# Patient Record
Sex: Male | Born: 1970
Health system: Southern US, Community
[De-identification: ages and names within clinical notes are randomized; demographics above are authoritative.]

## PROBLEM LIST (undated history)

## (undated) DIAGNOSIS — T7840XA Allergy, unspecified, initial encounter: Secondary | ICD-10-CM

## (undated) DIAGNOSIS — K604 Rectal fistula: Secondary | ICD-10-CM

## (undated) DIAGNOSIS — B009 Herpesviral infection, unspecified: Secondary | ICD-10-CM

## (undated) DIAGNOSIS — R011 Cardiac murmur, unspecified: Secondary | ICD-10-CM

## (undated) DIAGNOSIS — J309 Allergic rhinitis, unspecified: Secondary | ICD-10-CM

## (undated) HISTORY — DX: Allergic rhinitis, unspecified: J30.9

## (undated) HISTORY — DX: Cardiac murmur, unspecified: R01.1

## (undated) HISTORY — DX: Allergy, unspecified, initial encounter: T78.40XA

## (undated) HISTORY — DX: Rectal fistula: K60.4

## (undated) HISTORY — DX: Herpesviral infection, unspecified: B00.9

---

## 1992-10-29 HISTORY — PX: OTHER SURGICAL HISTORY: SHX169

## 1992-10-30 ENCOUNTER — Encounter (INDEPENDENT_AMBULATORY_CARE_PROVIDER_SITE_OTHER): Payer: Self-pay | Admitting: Internal Medicine

## 1999-02-26 ENCOUNTER — Encounter (INDEPENDENT_AMBULATORY_CARE_PROVIDER_SITE_OTHER): Payer: Self-pay | Admitting: Internal Medicine

## 1999-02-26 LAB — CONVERTED CEMR LAB
Blood Glucose, Fasting: 88 mg/dL
TSH: 1.14 microintl units/mL

## 2005-12-12 ENCOUNTER — Encounter (INDEPENDENT_AMBULATORY_CARE_PROVIDER_SITE_OTHER): Payer: Self-pay | Admitting: Internal Medicine

## 2005-12-12 ENCOUNTER — Ambulatory Visit: Payer: Self-pay | Admitting: Family Medicine

## 2005-12-12 LAB — CONVERTED CEMR LAB
RBC count: 5.52 10*6/uL
TSH: 1.18 microintl units/mL
WBC, blood: 6.6 10*3/uL

## 2006-08-15 ENCOUNTER — Ambulatory Visit: Payer: Self-pay | Admitting: Family Medicine

## 2007-12-24 ENCOUNTER — Encounter: Payer: Self-pay | Admitting: Internal Medicine

## 2008-05-07 ENCOUNTER — Telehealth (INDEPENDENT_AMBULATORY_CARE_PROVIDER_SITE_OTHER): Payer: Self-pay | Admitting: Internal Medicine

## 2008-11-09 ENCOUNTER — Emergency Department (HOSPITAL_COMMUNITY): Admission: EM | Admit: 2008-11-09 | Discharge: 2008-11-09 | Payer: Self-pay | Admitting: Emergency Medicine

## 2009-03-27 ENCOUNTER — Ambulatory Visit: Payer: Self-pay | Admitting: Family Medicine

## 2009-03-27 DIAGNOSIS — B001 Herpesviral vesicular dermatitis: Secondary | ICD-10-CM | POA: Insufficient documentation

## 2009-03-27 DIAGNOSIS — B009 Herpesviral infection, unspecified: Secondary | ICD-10-CM | POA: Insufficient documentation

## 2009-03-27 DIAGNOSIS — J309 Allergic rhinitis, unspecified: Secondary | ICD-10-CM | POA: Insufficient documentation

## 2009-04-10 ENCOUNTER — Ambulatory Visit: Payer: Self-pay | Admitting: Family Medicine

## 2009-04-15 LAB — CONVERTED CEMR LAB
AST: 27 units/L (ref 0–37)
Albumin: 4.4 g/dL (ref 3.5–5.2)
Alkaline Phosphatase: 54 units/L (ref 39–117)
Basophils Relative: 0.4 % (ref 0.0–3.0)
CO2: 29 meq/L (ref 19–32)
Calcium: 9.3 mg/dL (ref 8.4–10.5)
Chloride: 108 meq/L (ref 96–112)
Eosinophils Absolute: 0.2 10*3/uL (ref 0.0–0.7)
HCT: 47.2 % (ref 39.0–52.0)
HDL: 31.6 mg/dL — ABNORMAL LOW (ref >39.00)
Hemoglobin: 16.6 g/dL (ref 13.0–17.0)
Lymphocytes Relative: 28.8 % (ref 12.0–46.0)
MCHC: 35.2 g/dL (ref 30.0–36.0)
Neutro Abs: 4 10*3/uL (ref 1.4–7.7)
Potassium: 4.6 meq/L (ref 3.5–5.1)
RBC: 5.27 M/uL (ref 4.22–5.81)
Sodium: 143 meq/L (ref 135–145)
Total CHOL/HDL Ratio: 6
Total Protein: 6.7 g/dL (ref 6.0–8.3)

## 2010-04-05 ENCOUNTER — Telehealth: Payer: Self-pay | Admitting: Family Medicine

## 2010-05-23 DIAGNOSIS — K604 Rectal fistula, unspecified: Secondary | ICD-10-CM

## 2010-05-23 HISTORY — DX: Rectal fistula, unspecified: K60.40

## 2010-05-23 HISTORY — DX: Rectal fistula: K60.4

## 2010-06-23 NOTE — Progress Notes (Signed)
Summary: refill request for famvir  Phone Note Refill Request Message from:  Fax from Pharmacy  Refills Requested: Medication #1:  FAMVIR 500 MG TABS take 1 three times a day as directed as needed   Last Refilled: 11/18/2009 Faxed request from [facility] [facility], pt has not been seen in almost a year, no appts scheduled.  [facility] pt.  Initial call taken by: Lowella Petties CMA, AAMA,  April 05, 2010 12:03 PM  Follow-up for Phone Call        may refill x 3 refills.  due for CPE at his convenience Follow-up by: Eustaquio Boyden  MD,  April 05, 2010 12:24 PM  Additional Follow-up for Phone Call Additional follow up Details #1::        Left message on mail notifying patient to call and schedule CPx. Additional Follow-up by: Janee Morn CMA Duncan Dull),  April 05, 2010 3:40 PM    Prescriptions: FAMVIR 500 MG TABS (FAMCICLOVIR) take 1 three times a day as directed as needed  #21 x 3   Entered and Authorized by:   Eustaquio Boyden  MD   Signed by:   Eustaquio Boyden  MD on 04/05/2010   Method used:   Electronically to        [facility]  [address] (retail)       [address]       [place], [place]  [address]       Ph: [phone]       Fax: [phone]   RxID:   [id number]

## 2010-08-30 LAB — DIFFERENTIAL
Basophils Absolute: 0 10*3/uL (ref 0.0–0.1)
Basophils Relative: 1 % (ref 0–1)
Neutro Abs: 2.2 10*3/uL (ref 1.7–7.7)
Neutrophils Relative %: 46 % (ref 43–77)

## 2010-08-30 LAB — POCT URINALYSIS DIP (DEVICE)
Hgb urine dipstick: NEGATIVE
Nitrite: NEGATIVE
Urobilinogen, UA: 1 mg/dL (ref 0.0–1.0)
pH: 5.5 (ref 5.0–8.0)

## 2010-08-30 LAB — CBC
MCHC: 34.5 g/dL (ref 30.0–36.0)
Platelets: 141 10*3/uL — ABNORMAL LOW (ref 150–400)
RDW: 12.8 % (ref 11.5–15.5)

## 2010-09-03 ENCOUNTER — Telehealth: Payer: Self-pay | Admitting: Family Medicine

## 2010-09-03 DIAGNOSIS — Z0000 Encounter for general adult medical examination without abnormal findings: Secondary | ICD-10-CM

## 2010-09-03 NOTE — Telephone Encounter (Signed)
Message copied by Eustaquio Boyden on Fri Sep 03, 2010  7:57 AM ------      Message from: Mills Koller      Created: Thu Sep 02, 2010  4:09 PM       Patient is scheduled for CPX labs, please order future labs, Thanks , Camelia Eng

## 2010-09-08 ENCOUNTER — Encounter: Payer: Self-pay | Admitting: Family Medicine

## 2010-09-08 ENCOUNTER — Other Ambulatory Visit (INDEPENDENT_AMBULATORY_CARE_PROVIDER_SITE_OTHER): Payer: 59 | Admitting: Family Medicine

## 2010-09-08 DIAGNOSIS — Z0000 Encounter for general adult medical examination without abnormal findings: Secondary | ICD-10-CM

## 2010-09-08 LAB — LIPID PANEL
HDL: 35.5 mg/dL — ABNORMAL LOW (ref >39.00)
Total CHOL/HDL Ratio: 5

## 2010-09-08 LAB — COMPREHENSIVE METABOLIC PANEL
ALT: 54 U/L — ABNORMAL HIGH (ref 0–53)
AST: 32 U/L (ref 0–37)
Albumin: 4.1 g/dL (ref 3.5–5.2)
Calcium: 9.2 mg/dL (ref 8.4–10.5)
Chloride: 106 mEq/L (ref 96–112)
Creatinine, Ser: 0.9 mg/dL (ref 0.4–1.5)
Potassium: 4.4 mEq/L (ref 3.5–5.1)

## 2010-09-10 ENCOUNTER — Encounter: Payer: Self-pay | Admitting: Family Medicine

## 2010-09-10 ENCOUNTER — Ambulatory Visit (INDEPENDENT_AMBULATORY_CARE_PROVIDER_SITE_OTHER): Payer: 59 | Admitting: Family Medicine

## 2010-09-10 VITALS — BP 118/72 | HR 90 | Temp 98.3°F | Ht 69.0 in | Wt 185.0 lb

## 2010-09-10 DIAGNOSIS — Z0000 Encounter for general adult medical examination without abnormal findings: Secondary | ICD-10-CM | POA: Insufficient documentation

## 2010-09-10 DIAGNOSIS — Z1283 Encounter for screening for malignant neoplasm of skin: Secondary | ICD-10-CM | POA: Insufficient documentation

## 2010-09-10 NOTE — Assessment & Plan Note (Addendum)
Discussed healthy living, utd tetanus, not due for prostate/colon screening and no early fmhx. Requests referral to derm for skin screening, nothing concerning on exam today, discussed sunscreen and hat use. Reviewed blood work.  Encouraged more aerobic exercise for increase in HDL.

## 2010-09-10 NOTE — Progress Notes (Signed)
  Subjective:    Patient ID: Wayne Smith, male    DOB: Sep 15, 1970, 40 y.o.   MRN: [id number]  HPI CC: CPE today.  No questions/concerns.  Allergic rhinitis - takes allegra daily.  Has been on for 10 years.  Preventative: Tetanus 2007 Recent blood work.  Reviewed with patient. No BM changes, no nocturia, no blood in stool, strong stream  Review of Systems  Constitutional: Negative for fever, chills, activity change, appetite change, fatigue and unexpected weight change.  HENT: Negative for hearing loss and neck pain.   Eyes: Negative for visual disturbance.  Respiratory: Negative for cough, chest tightness, shortness of breath and wheezing.   Cardiovascular: Negative for chest pain, palpitations and leg swelling.  Gastrointestinal: Negative for nausea, vomiting, abdominal pain, diarrhea, constipation, blood in stool and abdominal distention.  Genitourinary: Negative for hematuria and difficulty urinating.  Musculoskeletal: Negative for myalgias and arthralgias.  Skin: Negative for rash.  Neurological: Negative for dizziness, seizures, syncope and headaches.  Hematological: Does not bruise/bleed easily.  Psychiatric/Behavioral: Negative for dysphoric mood. The patient is not nervous/anxious.        Objective:   Physical Exam  Nursing note and vitals reviewed. Constitutional: He is oriented to person, place, and time. He appears well-developed and well-nourished.  HENT:  Head: Normocephalic and atraumatic.  Right Ear: External ear normal.  Left Ear: External ear normal.  Nose: Nose normal.  Mouth/Throat: Oropharynx is clear and moist. No oropharyngeal exudate.  Eyes: Conjunctivae and EOM are normal. Pupils are equal, round, and reactive to light. No scleral icterus.  Neck: Normal range of motion. Neck supple. No thyromegaly present.  Cardiovascular: Normal rate, regular rhythm, normal heart sounds and intact distal pulses.   No murmur heard. Pulses:      Radial pulses are 2+  on the right side, and 2+ on the left side.  Pulmonary/Chest: Effort normal and breath sounds normal. No respiratory distress. He has no wheezes. He has no rales.  Abdominal: Soft. Bowel sounds are normal. He exhibits no distension and no mass. There is no tenderness. There is no rebound and no guarding.  Musculoskeletal: Normal range of motion.  Lymphadenopathy:    He has no cervical adenopathy.  Neurological: He is alert and oriented to person, place, and time.       CN grossly intact, station and gait intact  Skin: Skin is warm and dry.       Multiple nevi throughout back, none abnormal.  No lesions on scalp  Psychiatric: He has a normal mood and affect. His behavior is normal. Judgment and thought content normal.          Assessment & Plan:

## 2010-09-10 NOTE — Patient Instructions (Signed)
Good to meet you today. Pass by Marion's office to set up dermatology appointment for skin cancer screening.  Nothing concerning found today. Call us with questions.

## 2010-11-12 ENCOUNTER — Ambulatory Visit (INDEPENDENT_AMBULATORY_CARE_PROVIDER_SITE_OTHER): Payer: 59 | Admitting: Family Medicine

## 2010-11-12 ENCOUNTER — Encounter: Payer: Self-pay | Admitting: Family Medicine

## 2010-11-12 VITALS — BP 110/80 | HR 84 | Temp 98.5°F | Wt 185.0 lb

## 2010-11-12 DIAGNOSIS — K603 Anal fistula: Secondary | ICD-10-CM | POA: Insufficient documentation

## 2010-11-12 DIAGNOSIS — L0231 Cutaneous abscess of buttock: Secondary | ICD-10-CM

## 2010-11-12 MED ORDER — HYDROCODONE-ACETAMINOPHEN 7.5-500 MG PO TABS: 1.0000 | ORAL_TABLET | Freq: Three times a day (TID) | ORAL | Status: DC | PRN

## 2010-11-12 NOTE — Patient Instructions (Signed)
Looking like it's healing well. If any fever, spreading redness or draining pus, return sooner. Otherwise return end of next week to recheck. If packing falls out, that is ok as well.

## 2010-11-12 NOTE — Progress Notes (Signed)
  Subjective:    Patient ID: Wayne Smith, male    DOB: 29-Mar-1971, 40 y.o.   MRN: [id number]  HPI CC: abscess on buttock recheck  Several week h/o abscess L buttock.  Tues am seen at [facility] ER, with abscess, I&D performed and started doxycyline (3 days so far of treatment).  Also packed.  Prescribed 10 day course. Still packed.  Getting some drainage out.  Denies fevers/chills, spreading redness, nausea/vomiting.  H/o abscesses in past, only one as a kid though.  Review of Systems Per HPI    Objective:   Physical Exam  Nursing note and vitals reviewed. Constitutional: He appears well-developed and well-nourished. No distress.  Skin: Skin is warm and dry.          Site of I&D left buttock medially inspected No surrounding erythema,fluctuance. 4in packing removed, probed about 2.5cm deep with cotton tip applicator, repacked with ~1in. Pt tolerated well.          Assessment & Plan:

## 2010-11-12 NOTE — Assessment & Plan Note (Signed)
No evidence of worsening infection. Repacked with small amt iodine gauze. Finish abx course.  Refilled hydrocodone for pain. Supportive care instructions discussed. Red flags to return discussed.

## 2010-11-19 ENCOUNTER — Ambulatory Visit (INDEPENDENT_AMBULATORY_CARE_PROVIDER_SITE_OTHER): Payer: 59 | Admitting: Family Medicine

## 2010-11-19 ENCOUNTER — Encounter: Payer: Self-pay | Admitting: Family Medicine

## 2010-11-19 VITALS — BP 112/72 | HR 88 | Temp 98.4°F | Ht 69.0 in | Wt 182.8 lb

## 2010-11-19 DIAGNOSIS — L0231 Cutaneous abscess of buttock: Secondary | ICD-10-CM

## 2010-11-19 NOTE — Assessment & Plan Note (Signed)
Overall improved, almost completely resolved. rec keen moist with vaseline for next 2-3 days to ensure fully closes as somewhat dry today. Update [place] if fever, return of pain or other concern.

## 2010-11-19 NOTE — Progress Notes (Signed)
  Subjective:    Patient ID: LESS WOOLSEY, male    DOB: 1970-10-22, 40 y.o.   MRN: [id number]  HPI CC: wound f/u  H/o L buttock abscess drained at [facility] ER.  Seen last week, repacked.  Completed doxy course.  Feeling overall better, no pain.  Review of Systems Per HPI    Objective:   Physical Exam     Almost closed L buttock wound, no discharge, no drainage, no erythema or induration.   Assessment & Plan:

## 2010-12-03 ENCOUNTER — Encounter: Payer: Self-pay | Admitting: Family Medicine

## 2010-12-03 ENCOUNTER — Ambulatory Visit (INDEPENDENT_AMBULATORY_CARE_PROVIDER_SITE_OTHER): Payer: 59 | Admitting: Family Medicine

## 2010-12-03 VITALS — BP 110/70 | HR 84 | Temp 98.7°F | Wt 184.1 lb

## 2010-12-03 DIAGNOSIS — L0291 Cutaneous abscess, unspecified: Secondary | ICD-10-CM

## 2010-12-03 DIAGNOSIS — L0390 Cellulitis, unspecified: Secondary | ICD-10-CM

## 2010-12-03 DIAGNOSIS — L03317 Cellulitis of buttock: Secondary | ICD-10-CM

## 2010-12-03 DIAGNOSIS — L0231 Cutaneous abscess of buttock: Secondary | ICD-10-CM

## 2010-12-03 MED ORDER — DOXYCYCLINE HYCLATE 100 MG PO TABS: 100.0000 mg | ORAL_TABLET | Freq: Two times a day (BID) | ORAL | Status: AC

## 2010-12-03 NOTE — Patient Instructions (Signed)
Soak in a warm bath twice a day and use warm compresses several times as day.  10 day of doxycycline and call if not improved.  Take care.

## 2010-12-03 NOTE — Progress Notes (Signed)
H/o abscess.  Lanced 6/19.  Was improving and then he had return of sx.  Reopened ~1 week ago. Less painful now.  Drainage is green/pus now.  No FCNAVD.  Feeling well o/w.    Meds, vitals, and allergies reviewed.   ROS: See HPI.  Otherwise, noncontributory.  nad Normal ext rectal exam except for small (<1cm) lesion with small opening, too small to pack.  Small amount of green fluid expressed.  Minimally ttp, tolerated well.  No erythema locally.

## 2010-12-05 NOTE — Assessment & Plan Note (Signed)
Warm compresses, restart doxy, wound cx collected.  F/u prn.  He agrees.  No need to pack or I&D today. D/w pt.

## 2010-12-08 LAB — WOUND CULTURE: Gram Stain: NONE SEEN

## 2010-12-29 ENCOUNTER — Telehealth: Payer: Self-pay | Admitting: *Deleted

## 2010-12-29 NOTE — Telephone Encounter (Signed)
Would like him to come in for OV.

## 2010-12-29 NOTE — Telephone Encounter (Signed)
Message left advising patient to call and schedule a recheck.

## 2010-12-29 NOTE — Telephone Encounter (Signed)
Patient saw you for an abscess on 6-29 and then saw Dr. Para March for recheck. Patient says that it is not causing any pain, but at times it is still draining. He says he was told to call back if it didn't get any better. He is asking if he needs to have it rechecked or if he needs more antibiotic, or if it is normal that it is still draining at times. Please advise.

## 2010-12-31 ENCOUNTER — Ambulatory Visit (INDEPENDENT_AMBULATORY_CARE_PROVIDER_SITE_OTHER): Payer: 59 | Admitting: Surgery

## 2010-12-31 ENCOUNTER — Ambulatory Visit (INDEPENDENT_AMBULATORY_CARE_PROVIDER_SITE_OTHER): Payer: 59 | Admitting: Family Medicine

## 2010-12-31 ENCOUNTER — Encounter: Payer: Self-pay | Admitting: Family Medicine

## 2010-12-31 ENCOUNTER — Encounter (INDEPENDENT_AMBULATORY_CARE_PROVIDER_SITE_OTHER): Payer: Self-pay | Admitting: Surgery

## 2010-12-31 VITALS — BP 116/70 | HR 84 | Temp 98.1°F | Wt 185.2 lb

## 2010-12-31 VITALS — BP 122/78 | HR 80 | Temp 97.8°F | Ht 70.0 in | Wt 185.5 lb

## 2010-12-31 DIAGNOSIS — L0231 Cutaneous abscess of buttock: Secondary | ICD-10-CM

## 2010-12-31 DIAGNOSIS — L03317 Cellulitis of buttock: Secondary | ICD-10-CM

## 2010-12-31 NOTE — Progress Notes (Signed)
Wayne Smith comes in today and he has a history of a perirectal abscess and drained and continues to drain. On exam on the left side he appears to have an anterior fistula in ano. He denies any history of diarrhea or anything to suggest Crohn's disease.  I discussed exam under anesthesia and fistulotomy. He indicated there is a potential complication of a anal incontinence. We'll going to get this scheduled as an outpatient as soon as possible. His primary is Dr. Eustaquio Boyden.

## 2010-12-31 NOTE — Progress Notes (Signed)
  Subjective:    Patient ID: Wayne Smith, male    DOB: [birth year], 40 y.o.   MRN: [id number]  HPI CC: abscess still draining  H/o abscess. Lanced 6/19 at [facility], put on doxy x 10 days.  Packed.  Seen here for f/u and was healing well.  Then early 11/2010 opened and started draining again.  Seen and placed on another course of doxy 10 d.  Has drained on and off, has swollen again on and off.  Drains pus.  Going on for 2 months now, s/p 2 rounds of doxy.  At work, sitting for long hours, 10 hour days, hard surface.  Seems to get worse and swells during weekdays, better on weekends.  No fevers/chills, abd pain, no spreading redness.  Today actually looking better.  Review of Systems Per HPI    Objective:   Physical Exam AFVSS NAD Left medial cheek near anus with ~ 5mm post-inflammatory hyperpigmented macule, central opening and able to express pus.  No fluctuance appreciated, no induration/loculation appreciated.  no erythema.       Assessment & Plan:

## 2010-12-31 NOTE — Assessment & Plan Note (Signed)
Poorly healing abscess, continued drainage. Anticipate worsened by exposure to hard surface at work, recommend continued padding and warm compresses. No need for abx currently, not acutely infected. Given location of area, will refer to surg for eval and r/o sinus tract, other reason for poor healing.

## 2010-12-31 NOTE — Patient Instructions (Addendum)
Pass by cynthia's office for referral to general surgeon. Continue warm compresses. Continue padding area when at work. Call us with questions.

## 2011-01-10 ENCOUNTER — Encounter (HOSPITAL_BASED_OUTPATIENT_CLINIC_OR_DEPARTMENT_OTHER)
Admission: RE | Admit: 2011-01-10 | Discharge: 2011-01-10 | Disposition: A | Discharge status: Home / Self Care | Payer: 59 | Source: Ambulatory Visit | Attending: Surgery | Admitting: Surgery

## 2011-01-12 ENCOUNTER — Ambulatory Visit (HOSPITAL_BASED_OUTPATIENT_CLINIC_OR_DEPARTMENT_OTHER)
Admission: RE | Admit: 2011-01-12 | Discharge: 2011-01-12 | Disposition: A | Discharge status: Home / Self Care | Payer: 59 | Source: Ambulatory Visit | Attending: Surgery | Admitting: Surgery

## 2011-01-12 DIAGNOSIS — K603 Anal fistula, unspecified: Secondary | ICD-10-CM | POA: Insufficient documentation

## 2011-01-12 DIAGNOSIS — Z01810 Encounter for preprocedural cardiovascular examination: Secondary | ICD-10-CM | POA: Insufficient documentation

## 2011-01-12 DIAGNOSIS — Z01812 Encounter for preprocedural laboratory examination: Secondary | ICD-10-CM | POA: Insufficient documentation

## 2011-01-12 HISTORY — PX: ANAL FISTULECTOMY: SHX1139

## 2011-01-13 ENCOUNTER — Encounter: Payer: Self-pay | Admitting: Family Medicine

## 2011-01-26 NOTE — Op Note (Signed)
  NAMEShalon, Wayne Smith                   ACCOUNT NO.:  [id number]  MEDICAL RECORD NO.:  [id number]  LOCATION:                                 FACILITY:  PHYSICIAN:  Thornton Park. Daphine Deutscher, MD       DATE OF BIRTH:  DATE OF PROCEDURE:  01/12/2011 DATE OF DISCHARGE:                              OPERATIVE REPORT   PREOPERATIVE DIAGNOSIS:  Draining area from anal region anteriorly on the left side.  SURGEON:  Thornton Park. Daphine Deutscher, MD  PROCEDURE:  Exam under anesthesia with opening of fistula, unable to completely delineate the entire length of this cavity.  COURSE IN HOSPITAL:  This is a 40 year old white male who was taken to room 5 at [facility] and given general by LMA, put up in the dorsal lithotomy position in stirrups.  On the left side at about the 1 o'clock position, there is a 1 mm opening that where I previously noted in the office.  At that time, I had also noticed some creamy pus that was visible inside of his anal canal.  I first put a Ferguson retractor in the anus and looking a Goodsall's rule, put a probe into the cavity and found it tended to want to tract radially inward, but there was no obvious fistula into the anal canal.  I then took a blunt-tipped needle and injected the opening on the skin with peroxide.  There was no bubbling noted inside the anal canal and therefore no connection could be demonstrated.  However, there were two areas at 12 o'clock just in the crypts of the I did dentate line where there was this creamy white pus that was draining.  It could be that these were connected differently or related, but I could not demonstrate a good definite fistula.  Therefore, I went ahead and cut down into this cavity at the 1 o'clock position and fulgurated the granulation tissue within this and debrided this with blunt dissection best I could.  I then injected entire perianal region with some 0.25% Marcaine with epinephrine and massaged with Betadine ointment.  I  explained this to his support person and I plan I will see him back in office in about 3 weeks and we may need to come back to the operating room should a definite fistula be demonstrable.  However, it could be that there are 2 processes and this hopefully this outside process will heal.     Thornton Park. Daphine Deutscher, MD     MBM/MEDQ  D:  01/12/2011  T:  01/12/2011  Job:  [id number]  Electronically Signed by Luretha Murphy MD on 01/26/2011 01:38:29 PM

## 2011-02-04 ENCOUNTER — Encounter (INDEPENDENT_AMBULATORY_CARE_PROVIDER_SITE_OTHER): Payer: Self-pay | Admitting: Surgery

## 2011-02-04 ENCOUNTER — Ambulatory Visit (INDEPENDENT_AMBULATORY_CARE_PROVIDER_SITE_OTHER): Payer: 59 | Admitting: Surgery

## 2011-02-04 VITALS — BP 142/70 | HR 88

## 2011-02-04 DIAGNOSIS — L0889 Other specified local infections of the skin and subcutaneous tissue: Secondary | ICD-10-CM

## 2011-02-04 DIAGNOSIS — L089 Local infection of the skin and subcutaneous tissue, unspecified: Secondary | ICD-10-CM

## 2011-02-04 NOTE — Progress Notes (Signed)
Wayne Smith returns today after a  examination under anesthesia in which I found a little collection of pus in the 12:00 position and his opening in the skin was at about the 1:30 position. I injected the area with peroxide and did not demonstrate a fistula. I probed it with a small probe and could not detect a fistula. I opened the sinus tract and irrigated out. He said it was not draining for about 40 weeks and then recently started having some purulent drainage. Today there is a low bit of purulent drainage from the site of his opening of the sinus. I will see him again in 4 weeks and see what is going on. It is still persistent we may entertain injection of the sinus with contrast under fluoroscopy or injecting him with some contrast here in the office.  Plan return 4 weeks

## 2011-02-24 ENCOUNTER — Telehealth: Payer: Self-pay | Admitting: *Deleted

## 2011-02-24 ENCOUNTER — Telehealth (INDEPENDENT_AMBULATORY_CARE_PROVIDER_SITE_OTHER): Payer: Self-pay | Admitting: General Surgery

## 2011-02-24 NOTE — Telephone Encounter (Signed)
Mr andreoni left a message on my voicemail. Tried to contact him back, left a message on his voicemail with directions to call and speak with a triage nurse if I was unavailable

## 2011-02-24 NOTE — Telephone Encounter (Signed)
Noted. Agree. Thanks.

## 2011-02-24 NOTE — Telephone Encounter (Signed)
Pt states he had surgery in august for an abscess and he is still having problems.  The surgeon has not released him yet, but he doesn't go back to see him for 2 weeks.  Advised pt that if he is still having problems, post op, and surgeon has not released him, he needs to call the surgeon to report the problems.  Pt agreed.

## 2011-02-25 ENCOUNTER — Ambulatory Visit (INDEPENDENT_AMBULATORY_CARE_PROVIDER_SITE_OTHER): Payer: 59 | Admitting: Surgery

## 2011-02-25 ENCOUNTER — Encounter (INDEPENDENT_AMBULATORY_CARE_PROVIDER_SITE_OTHER): Payer: Self-pay | Admitting: Surgery

## 2011-02-25 VITALS — BP 128/76 | HR 84 | Temp 97.9°F | Resp 16 | Ht 70.0 in | Wt 185.2 lb

## 2011-02-25 DIAGNOSIS — L0231 Cutaneous abscess of buttock: Secondary | ICD-10-CM

## 2011-02-25 NOTE — Patient Instructions (Signed)
Remove packing tomorrow.  Clean this area with Neosporin and a q-tip daily.  Follow-up next week with Dr. Daphine Deutscher.

## 2011-02-25 NOTE — Progress Notes (Signed)
Urgent Office  The patient comes in today with complaints of continued drainage from his fistula site. This area is not causing any tenderness. No bleeding noted. He does continues to notice a tiny amount of purulent drainage. He has an appointment in 2 weeks with Dr. Daphine Deutscher.  On examination the sinus tract opening these have some small purulent drainage. There is only a pinhole opening. I inserted the tip of a hemostat and open this area wider. This does seem to track for at least a couple centimeters. We packed the sinus tract with quarter-inch new gauze to allow to drain. He will remove the packing in 24 hours and then will treat this area with Neosporin and a cotton swab. Followup with Dr. Daphine Deutscher in one week.   Previous note:   Mr. Kops returns today after a  examination under anesthesia in which I found a little collection of pus in the 12:00 position and his opening in the skin was at about the 1:30 position. I injected the area with peroxide and did not demonstrate a fistula. I probed it with a small probe and could not detect a fistula. I opened the sinus tract and irrigated out. He said it was not draining for about 40 weeks and then recently started having some purulent drainage. Today there is a low bit of purulent drainage from the site of his opening of the sinus. I will see him again in 4 weeks and see what is going on. It is still persistent we may entertain injection of the sinus with contrast under fluoroscopy or injecting him with some contrast here in the office.  Plan return 4 weeks

## 2011-03-03 ENCOUNTER — Ambulatory Visit (INDEPENDENT_AMBULATORY_CARE_PROVIDER_SITE_OTHER): Payer: 59 | Admitting: Surgery

## 2011-03-03 VITALS — BP 116/82 | HR 66 | Temp 96.8°F | Resp 14 | Ht 70.0 in | Wt 186.4 lb

## 2011-03-03 DIAGNOSIS — K603 Anal fistula: Secondary | ICD-10-CM

## 2011-03-03 NOTE — Progress Notes (Signed)
Wayne Smith saw Dr. Margaree Mackintosh regarding recurrent drainage from this area. He opened a bit. He continues to have some yellow purulent drainage from this sinus which is anterior and on the left side. A massage the area today it was not tender but could note some drainage of the same material.  Prior to taking him back to the operating room to look for a fistula I think we should do an MRI to see where this collection is. Previously I could not demonstrate a fistula. However he does have some purulent drainage into his anal canal. This does not appear feculent. Plan MRI of pelvis and follow up in the office after that.

## 2011-03-07 ENCOUNTER — Ambulatory Visit
Admission: RE | Admit: 2011-03-07 | Discharge: 2011-03-07 | Disposition: A | Discharge status: Home / Self Care | Payer: 59 | Source: Ambulatory Visit | Attending: Surgery | Admitting: Surgery

## 2011-03-07 DIAGNOSIS — K603 Anal fistula: Secondary | ICD-10-CM

## 2011-03-07 MED ORDER — GADOBENATE DIMEGLUMINE 529 MG/ML IV SOLN
17.0000 mL | Freq: Once | INTRAVENOUS | Status: AC | PRN
  Administered 2011-03-07: 17 mL via INTRAVENOUS

## 2011-03-10 ENCOUNTER — Encounter (INDEPENDENT_AMBULATORY_CARE_PROVIDER_SITE_OTHER): Payer: 59 | Admitting: Surgery

## 2011-03-10 ENCOUNTER — Ambulatory Visit (INDEPENDENT_AMBULATORY_CARE_PROVIDER_SITE_OTHER): Payer: 59 | Admitting: Surgery

## 2011-03-10 ENCOUNTER — Encounter (INDEPENDENT_AMBULATORY_CARE_PROVIDER_SITE_OTHER): Payer: Self-pay | Admitting: Surgery

## 2011-03-10 VITALS — BP 110/82 | HR 64 | Temp 97.6°F | Resp 18 | Ht 70.0 in | Wt 186.0 lb

## 2011-03-10 DIAGNOSIS — K603 Anal fistula: Secondary | ICD-10-CM

## 2011-03-10 DIAGNOSIS — L03317 Cellulitis of buttock: Secondary | ICD-10-CM

## 2011-03-10 DIAGNOSIS — K612 Anorectal abscess: Secondary | ICD-10-CM

## 2011-03-10 DIAGNOSIS — L0231 Cutaneous abscess of buttock: Secondary | ICD-10-CM

## 2011-03-10 NOTE — Progress Notes (Signed)
MR evaluation of the pelvis was performed. I reviewed the images. 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 A direct communication between these would reflect a grade 4 transsphincteric fistula. I thank that this is a high transsphincteric fistula that is intermittently sealing. I discussed the findings with him. I asked him to obtain a CD that contains his MR scan.  We will make him an appointment with Dr. Mirian Mo at [facility].

## 2011-03-10 NOTE — Progress Notes (Signed)
Addended by: Latricia Heft on: 03/10/2011 10:08 AM   Modules accepted: Orders

## 2011-03-25 ENCOUNTER — Encounter (INDEPENDENT_AMBULATORY_CARE_PROVIDER_SITE_OTHER): Payer: 59 | Admitting: Surgery

## 2011-05-02 ENCOUNTER — Other Ambulatory Visit: Payer: Self-pay | Admitting: Family Medicine

## 2011-05-02 ENCOUNTER — Ambulatory Visit (INDEPENDENT_AMBULATORY_CARE_PROVIDER_SITE_OTHER): Payer: 59 | Admitting: Family Medicine

## 2011-05-02 ENCOUNTER — Encounter: Payer: Self-pay | Admitting: Family Medicine

## 2011-05-02 VITALS — BP 100/70 | HR 84 | Temp 99.4°F | Wt 189.0 lb

## 2011-05-02 DIAGNOSIS — L02419 Cutaneous abscess of limb, unspecified: Secondary | ICD-10-CM | POA: Insufficient documentation

## 2011-05-02 DIAGNOSIS — IMO0002 Reserved for concepts with insufficient information to code with codable children: Secondary | ICD-10-CM

## 2011-05-02 DIAGNOSIS — Z22322 Carrier or suspected carrier of Methicillin resistant Staphylococcus aureus: Secondary | ICD-10-CM

## 2011-05-02 MED ORDER — SULFAMETHOXAZOLE-TMP DS 800-160 MG PO TABS: 2.0000 | ORAL_TABLET | Freq: Two times a day (BID) | ORAL | Status: AC

## 2011-05-02 MED ORDER — MUPIROCIN CALCIUM 2 % NA OINT: TOPICAL_OINTMENT | Freq: Two times a day (BID) | NASAL | Status: AC

## 2011-05-02 NOTE — Assessment & Plan Note (Addendum)
Multiple under left axilla. Nothing to drain today - induration no fluctuance. Hydradenitis vs MRSA.  Anticipate due to MRSA. Treat with bactrim ds 2 bid x 10 days. Have sent MRSA nasal culture, may treat with mupirocin in nose pending results. If not improving, f/u with derm.

## 2011-05-02 NOTE — Progress Notes (Signed)
  Subjective:    Patient ID: Wayne Smith, male    DOB: August 08, 1970, 40 y.o.   MRN: [id number]  HPI CC: abscess under L arm  1 wk h/o abscesses that developed under left arm.  Has had multiple boils that have drained on that side over the last week.  No fevers/chills, nausea/vomiting.  + red but not spreading.  Complicated course with perianal abscess that developed into fistula this past summer, followed currently by [facility].  Review of Systems Per HPI    Objective:   Physical Exam  Nursing note and vitals reviewed. Skin: Skin is warm and dry.       Right axilla without boils. Left axilla with multiple abscesses, 2 larger ones on upper inner arm and mid axilla, indurated and erythematous but no fluctuance.  Inner arm 2cm diameter, mid axilla 1.5cm diameter.  Several smaller erythematous areas throughout axilla, inferior axilla lesion has drained.      Assessment & Plan:

## 2011-05-02 NOTE — Patient Instructions (Addendum)
You have multiple axillary abscesses, suspicious for MRSA. Treat with bactrim 2 twice daily for 10 days (take with food to decrease upset stomach). We have checked for MRSA in the nose. If positive, we will call you to start nasal treatment. If area coming to a head, please return for possible drainage. If not improving, follow up with dermatology. Continue warm compresses.  Community-Associated MRSA CA-MRSA stands for community-associated methicillin-resistant Staphylococcus aureus. MRSA is a type of bacteria that is resistant to some common antibiotics. It can cause infections in the skin and many other places in the body. Staphylococcus aureus, often called "staph," is a bacteria that normally lives on the skin or in the nose. Staph on the surface of the skin or in the nose does not cause problems. However, if the staph enters the body through a cut, wound, or break in the skin, an infection can happen. Up until recently, infections with the MRSA type of staph mainly occurred in hospitals and other healthcare settings. There are now increasing problems with MRSA infections in the community as well. Infections with MRSA may be very serious or even life-threatening. CA-MRSA is becoming more common. It is known to spread in crowded settings, in jails and prisons, and in situations where there is close skin-to-skin contact, such as during sporting events or in locker rooms. MRSA can be spread through shared items, such as children's toys, razors, towels, or sports equipment.  CAUSES All staph, including MRSA, are normally harmless unless they enter the body through a scratch, cut, or wound, such as with surgery. All staph, including MRSA, can be spread from person-to-person by touching contaminated objects or through direct contact.  MRSA now causes illness in people who have not been in hospitals or other healthcare facilities. Cases of MRSA diseases in the community have been associated with:    Recent antibiotic use.   Sharing contaminated towels or clothes.   Having active skin diseases.   Participating in contact sports.   Living in crowded settings.   Intravenous (IV) drug use.   Community-associated MRSA infections are usually skin infections, but may cause other severe illnesses.   Staph bacteria are one of the most common causes of skin infection. However, they are also a common cause of pneumonia, bone or joint infections, and bloodstream infections.  DIAGNOSIS Diagnosis of MRSA is done by cultures of fluid samples that may come from:  Swabs taken from cuts or wounds in infected areas.   Nasal swabs.   Saliva or deep cough specimens from the lungs (sputum).   Urine.   Blood.  Many people are "colonized" with MRSA but have no signs of infection. This means that people carry the MRSA germ on their skin or in their nose and may never develop MRSA infection.  TREATMENT  Treatment varies and is based on how serious, how deep, or how extensive the infection is. For example:  Some skin infections, such as a small boil or abscess, may be treated by draining yellowish-white fluid (pus) from the site of the infection.   Deeper or more widespread soft tissue infections are usually treated with surgery to drain pus and with antibiotic medicine given by vein or by mouth. This may be recommended even if you are pregnant.   Serious infections may require a hospital stay.  If antibiotics are given, they may be needed for several weeks. PREVENTION Because many people are colonized with staph, including MRSA, preventing the spread of the bacteria from person-to-person is most  important. The best way to prevent the spread of bacteria and other germs is through proper hand washing or by using alcohol-based hand disinfectants. The following are other ways to help prevent MRSA infection within community settings.   Wash your hands frequently with soap and water for at least 15  seconds. Otherwise, use alcohol-based hand disinfectants when soap and water is not available.   Make sure people who live with you wash their hands often, too.   Do not share personal items. For example, avoid sharing razors and other personal hygiene items, towels, clothing, and athletic equipment.   Wash and dry your clothes and bedding at the warmest temperatures recommended on the labels.   Keep wounds covered. Pus from infected sores may contain MRSA and other bacteria. Keep cuts and abrasions clean and covered with germ-free (sterile), dry bandages until they are healed.   If you have a wound that appears infected, ask your caregiver if a culture for MRSA and other bacteria should be done.   If you are breastfeeding, talk to your caregiver about MRSA. You may be asked to temporarily stop breastfeeding.  HOME CARE INSTRUCTIONS   Take your antibiotics as directed. Finish them even if you start to feel better.   Avoid close contact with those around you as much as possible. Do not use towels, razors, toothbrushes, bedding, or other items that will be used by others.   To fight the infection, follow your caregiver's instructions for wound care. Wash your hands before and after changing your bandages.   If you have an intravascular device, such as a catheter, make sure you know how to care for it.   Be sure to tell any healthcare providers that you have MRSA so they are aware of your infection.  SEEK IMMEDIATE MEDICAL CARE IF:  The infection appears to be getting worse. Signs include:   Increased warmth, redness, or tenderness around the wound site.   A red line that extends from the infection site.   A dark color in the area around the infection.   Wound drainage that is tan, yellow, or green.   A bad smell coming from the wound.   You feel sick to your stomach (nauseous) and throw up (vomit) or cannot keep medicine down.   You have a fever.   Your baby is older than 3  months with a rectal temperature of 102 F (38.9 C) or higher.   Your baby is 45 months old or younger with a rectal temperature of 100.4 F (38 C) or higher.   You have difficulty breathing.  MAKE SURE YOU:   Understand these instructions.   Will watch your condition.   Will get help right away if you are not doing well or get worse.  Document Released: 08/12/2005 Document Revised: 01/19/2011 Document Reviewed: 08/12/2010 [facility] Patient Information 2012 [place], [place].

## 2011-05-05 ENCOUNTER — Telehealth: Payer: Self-pay

## 2011-05-05 NOTE — Telephone Encounter (Signed)
Pt left v/m on triage phone and wanted to get culture report taken earlier in the week. Pt can be reached at [phone].

## 2011-05-06 ENCOUNTER — Telehealth: Payer: Self-pay | Admitting: Internal Medicine

## 2011-05-06 NOTE — Telephone Encounter (Signed)
Patient called and stated he would like to know the results on his MRSA culture.  Please advise.

## 2011-05-06 NOTE — Telephone Encounter (Signed)
Patient notified. He said two places under his arm are doing better but one place has gotten bigger. I advised to continue warm compresses and finish abx. I also advised that if it got worse to go to Saturday clinic or [facility]. He verbalized understanding.

## 2011-05-06 NOTE — Telephone Encounter (Signed)
Patient aware of results.

## 2011-05-06 NOTE — Telephone Encounter (Signed)
Please notify MRSA culture returned negative. Doesn't need to take nasal antibiotic ointment for now. How is skin infection doing on bactrim DS?

## 2011-05-10 ENCOUNTER — Other Ambulatory Visit: Payer: Self-pay | Admitting: Family Medicine

## 2011-12-10 ENCOUNTER — Other Ambulatory Visit: Payer: Self-pay | Admitting: Family Medicine

## 2011-12-10 DIAGNOSIS — E785 Hyperlipidemia, unspecified: Secondary | ICD-10-CM

## 2011-12-16 ENCOUNTER — Other Ambulatory Visit (INDEPENDENT_AMBULATORY_CARE_PROVIDER_SITE_OTHER): Payer: 59

## 2011-12-16 DIAGNOSIS — E785 Hyperlipidemia, unspecified: Secondary | ICD-10-CM

## 2011-12-16 LAB — COMPREHENSIVE METABOLIC PANEL
AST: 25 U/L (ref 0–37)
Alkaline Phosphatase: 53 U/L (ref 39–117)
BUN: 14 mg/dL (ref 6–23)
Creatinine, Ser: 1 mg/dL (ref 0.4–1.5)
Potassium: 4.3 mEq/L (ref 3.5–5.1)
Total Bilirubin: 0.9 mg/dL (ref 0.3–1.2)

## 2011-12-16 LAB — LIPID PANEL
Cholesterol: 163 mg/dL (ref 0–200)
HDL: 35.6 mg/dL — ABNORMAL LOW (ref >39.00)
LDL Cholesterol: 114 mg/dL — ABNORMAL HIGH (ref 0–99)
VLDL: 13 mg/dL (ref 0.0–40.0)

## 2011-12-23 ENCOUNTER — Ambulatory Visit (INDEPENDENT_AMBULATORY_CARE_PROVIDER_SITE_OTHER): Payer: 59 | Admitting: Family Medicine

## 2011-12-23 ENCOUNTER — Encounter: Payer: Self-pay | Admitting: Family Medicine

## 2011-12-23 VITALS — BP 108/80 | HR 88 | Temp 98.3°F | Ht 70.0 in | Wt 186.2 lb

## 2011-12-23 DIAGNOSIS — S39012A Strain of muscle, fascia and tendon of lower back, initial encounter: Secondary | ICD-10-CM | POA: Insufficient documentation

## 2011-12-23 DIAGNOSIS — E785 Hyperlipidemia, unspecified: Secondary | ICD-10-CM

## 2011-12-23 DIAGNOSIS — K603 Anal fistula: Secondary | ICD-10-CM

## 2011-12-23 DIAGNOSIS — S335XXA Sprain of ligaments of lumbar spine, initial encounter: Secondary | ICD-10-CM

## 2011-12-23 DIAGNOSIS — Z0000 Encounter for general adult medical examination without abnormal findings: Secondary | ICD-10-CM

## 2011-12-23 MED ORDER — CYCLOBENZAPRINE HCL 10 MG PO TABS: 10.0000 mg | ORAL_TABLET | Freq: Two times a day (BID) | ORAL | Status: AC | PRN

## 2011-12-23 NOTE — Assessment & Plan Note (Signed)
Reviewed labs. Encouraged increased aerobic exercise to raise HDL.

## 2011-12-23 NOTE — Assessment & Plan Note (Signed)
Followed by Dr. Byrd Hesselbach at [facility]. Still not resolved.

## 2011-12-23 NOTE — Assessment & Plan Note (Signed)
Preventative protocols reviewed and updated unless pt declined. Reviewed blood work. Discussed healthy diet/lifestyle.

## 2011-12-23 NOTE — Assessment & Plan Note (Signed)
No red flags. Treat with continued OTC NSAIDs, add flexeril, and stretching exercises from [facility] pt advisor on lower back pain. Encouraged walking

## 2011-12-23 NOTE — Progress Notes (Signed)
Subjective:    Patient ID: Wayne Smith, male    DOB: January 10, 1971, 41 y.o.   MRN: [id number]  HPI CC: CPE  Lower back pain - going on for last several weeks.  Better this week but still some present.  Thinks tweaked by doing things around the house.  Slowly getting.  No shooting pain down legs, numbness, weakness, fevers/chills.  No bowel/bladder incontinence.  H/o perirectal fistula.  Has had a few episodes since then.  Last saw 06/2011.  First saw Dr. Daphine Deutscher then Dr. Byrd Hesselbach in [facility].  S/p 2 surgeries, no resolution.  Just watching for now, hesitant to rpt surgery again.  Never any clear cut path for fistula found.  Wt Readings from Last 3 Encounters:  12/23/11 186 lb 4 oz (84.482 kg)  05/02/11 189 lb (85.73 kg)  03/10/11 186 lb (84.369 kg)    Preventative:  Tetanus 2007  Has not received flu shot. Recent blood work. Reviewed with patient.  No BM changes, no nocturia, no blood in stool, strong stream  Caffeine: 2 cups coffee, sweet tea daily Lives with GF, dogs and cats Occupation: mechanical Activity: stays active at home and work - [place] acres, lots of walking Diet: good water, fruits/vegetables daily  Medications and allergies reviewed and updated in chart.  Past histories reviewed and updated if relevant as below. Patient Active Problem List  Diagnosis  . HERPES LABIALIS  . ALLERGIC RHINITIS, CHRONIC  . Healthcare maintenance  . Abscess of buttock, left  . Axillary abscess  . Dyslipidemia   Past Medical History  Diagnosis Date  . Allergic rhinitis, cause unspecified   . Herpes simplex without mention of complication   . Perirectal fistula 2012   Past Surgical History  Procedure Date  . Chest x-ray 10/29/92    Negative  . Anal fistulectomy 01/12/11    x2   History  Substance Use Topics  . Smoking status: Never Smoker   . Smokeless tobacco: Never Used  . Alcohol Use: Yes     Occasional   Family History  Problem Relation Age of Onset  . Stroke Mother 54    . Diabetes Maternal Grandmother   . Cancer Neg Hx   . Coronary artery disease Neg Hx   . Heart disease Neg Hx   . Crohn's disease Neg Hx    No Known Allergies Current Outpatient Prescriptions on File Prior to Visit  Medication Sig Dispense Refill  . Ascorbic Acid (VITAMIN C) 1000 MG tablet Take 1,000 mg by mouth daily.        . famciclovir (FAMVIR) 500 MG tablet TAKE ONE TABLET BY MOUTH THREE TIMES DAILY AS NEEDED AS DIRECTED  21 tablet  2  . fexofenadine (ALLEGRA) 180 MG tablet Take 180 mg by mouth daily.      . Multiple Vitamin (MULTIVITAMIN) tablet Take 1 tablet by mouth daily.        . cetirizine (ZYRTEC ALLERGY) 10 MG tablet Take 10 mg by mouth daily.           Review of Systems  Constitutional: Negative for fever, chills, activity change, appetite change, fatigue and unexpected weight change.  HENT: Negative for hearing loss and neck pain.   Eyes: Negative for visual disturbance.  Respiratory: Negative for cough, chest tightness, shortness of breath and wheezing.   Cardiovascular: Negative for chest pain, palpitations and leg swelling.  Gastrointestinal: Negative for nausea, vomiting, abdominal pain, diarrhea, constipation, blood in stool and abdominal distention.  Genitourinary: Negative for hematuria  and difficulty urinating.  Musculoskeletal: Positive for back pain. Negative for myalgias and arthralgias.  Skin: Negative for rash.  Neurological: Negative for dizziness, seizures, syncope and headaches.  Hematological: Does not bruise/bleed easily.  Psychiatric/Behavioral: Negative for dysphoric mood. The patient is not nervous/anxious.        Objective:   Physical Exam  Nursing note and vitals reviewed. Constitutional: He is oriented to person, place, and time. He appears well-developed and well-nourished. No distress.  HENT:  Head: Normocephalic and atraumatic.  Right Ear: External ear normal.  Left Ear: External ear normal.  Nose: Nose normal.  Mouth/Throat:  Oropharynx is clear and moist. No oropharyngeal exudate.  Eyes: Conjunctivae and EOM are normal. Pupils are equal, round, and reactive to light. No scleral icterus.  Neck: Normal range of motion. Neck supple. No thyromegaly present.  Cardiovascular: Normal rate, regular rhythm, normal heart sounds and intact distal pulses.   No murmur heard. Pulses:      Radial pulses are 2+ on the right side, and 2+ on the left side.  Pulmonary/Chest: Effort normal and breath sounds normal. No respiratory distress. He has no wheezes. He has no rales.  Abdominal: Soft. Bowel sounds are normal. He exhibits no distension and no mass. There is no tenderness. There is no rebound and no guarding.  Musculoskeletal: Normal range of motion. He exhibits no edema.       No midline or paraspinous mm tenderness. Neg SLR bilaterally. No pain with int/ext rotation at hip No pain with FABER test No pain with palpation of SIJ or GTB or at sciatic notch No scoliosis.  Lymphadenopathy:    He has no cervical adenopathy.  Neurological: He is alert and oriented to person, place, and time. He has normal strength and normal reflexes. No sensory deficit. He exhibits normal muscle tone.  Reflex Scores:      Patellar reflexes are 2+ on the right side and 2+ on the left side.      Achilles reflexes are 2+ on the right side and 2+ on the left side.      CN grossly intact, station and gait intact  Skin: Skin is warm and dry. No rash noted.  Psychiatric: He has a normal mood and affect. His behavior is normal. Judgment and thought content normal.       Assessment & Plan:

## 2011-12-23 NOTE — Patient Instructions (Signed)
Do stretching exercises for back I think you have lumbar strain. May use flexeril as muscle relaxant but this may make you sleepy. Return in 1-2 years for next physical or as needed

## 2012-04-23 ENCOUNTER — Other Ambulatory Visit: Payer: Self-pay | Admitting: Family Medicine

## 2012-06-12 ENCOUNTER — Encounter: Payer: Self-pay | Admitting: Family Medicine

## 2012-06-12 ENCOUNTER — Ambulatory Visit (INDEPENDENT_AMBULATORY_CARE_PROVIDER_SITE_OTHER): Payer: 59 | Admitting: Family Medicine

## 2012-06-12 VITALS — BP 122/78 | HR 98 | Temp 100.0°F | Ht 70.0 in | Wt 188.8 lb

## 2012-06-12 DIAGNOSIS — J209 Acute bronchitis, unspecified: Secondary | ICD-10-CM | POA: Insufficient documentation

## 2012-06-12 MED ORDER — GUAIFENESIN-CODEINE 100-10 MG/5ML PO SYRP: 5.0000 mL | ORAL_SOLUTION | Freq: Every evening | ORAL | Status: DC | PRN

## 2012-06-12 MED ORDER — AZITHROMYCIN 250 MG PO TABS: ORAL_TABLET | ORAL | Status: DC

## 2012-06-12 NOTE — Assessment & Plan Note (Signed)
With fevers/chills last night, however lung exam clear today, nontoxic. Supportive care as per instructions. Discussed likely viral nature of illness, if not improving as expected or any worsening, fill zpack to cover bacterial bronchitis. Pt agrees with plan. rec flu shot yearly.

## 2012-06-12 NOTE — Progress Notes (Signed)
  Subjective:    Patient ID: Wayne Smith, male    DOB: 1970/07/03, 42 y.o.   MRN: [id number]  HPI CC: chest congestion  3d h/o discomfort with cough in chest.  Cough progressively worsening.  This morning productive of phlegm.  Chills and fever last night as well.  PNdrainage.  Chest > head congestion as well as discomfort.  progressively worsening.  Hasn't tried any meds for this so far.  No abd pain, ear or tooth pain, HA, SOB or wheezing.  No sick contacts at home. + work contacts sick recently, flu at work. Family member smokes. No h/o asthma.  + allergic rhinitis No h/o PNA. Didn't get flu shot this year.  Past Medical History  Diagnosis Date  . Allergic rhinitis, cause unspecified   . Herpes simplex without mention of complication   . Perirectal fistula 2012     Review of Systems Per HPI    Objective:   Physical Exam     Assessment & Plan:

## 2012-06-12 NOTE — Patient Instructions (Signed)
I think you are developing bronchitis, likely viral at this point. Viral infections usually take 7-10 days to resolve.  The cough can last a few weeks to go away. Use medication as prescribed: cheratussin for cough at night. Start simple mucinex or immediate release guaifenesin with plenty of fluid to mobilize mucous. Out of work note today. Push fluids and plenty of rest. Fill zpack fever continues past 1-2 days, or if worsening productive cough, or not improving as expected. Call clinic with questions.  Good to see you today.

## 2013-03-28 ENCOUNTER — Other Ambulatory Visit: Payer: Self-pay | Admitting: Family Medicine

## 2013-03-28 DIAGNOSIS — E785 Hyperlipidemia, unspecified: Secondary | ICD-10-CM

## 2013-03-29 ENCOUNTER — Other Ambulatory Visit (INDEPENDENT_AMBULATORY_CARE_PROVIDER_SITE_OTHER): Payer: 59

## 2013-03-29 DIAGNOSIS — E785 Hyperlipidemia, unspecified: Secondary | ICD-10-CM

## 2013-03-29 LAB — LIPID PANEL
HDL: 32.6 mg/dL — ABNORMAL LOW (ref >39.00)
Triglycerides: 120 mg/dL (ref 0.0–149.0)

## 2013-03-29 LAB — BASIC METABOLIC PANEL
CO2: 30 mEq/L (ref 19–32)
Calcium: 9.3 mg/dL (ref 8.4–10.5)
Creatinine, Ser: 0.8 mg/dL (ref 0.4–1.5)
Glucose, Bld: 94 mg/dL (ref 70–99)

## 2013-04-05 ENCOUNTER — Ambulatory Visit (INDEPENDENT_AMBULATORY_CARE_PROVIDER_SITE_OTHER): Payer: 59 | Admitting: Family Medicine

## 2013-04-05 ENCOUNTER — Encounter: Payer: Self-pay | Admitting: Family Medicine

## 2013-04-05 VITALS — BP 114/82 | HR 96 | Temp 98.5°F | Ht 69.0 in | Wt 187.5 lb

## 2013-04-05 DIAGNOSIS — Z0000 Encounter for general adult medical examination without abnormal findings: Secondary | ICD-10-CM

## 2013-04-05 DIAGNOSIS — K603 Anal fistula, unspecified: Secondary | ICD-10-CM

## 2013-04-05 DIAGNOSIS — E785 Hyperlipidemia, unspecified: Secondary | ICD-10-CM

## 2013-04-05 DIAGNOSIS — Z23 Encounter for immunization: Secondary | ICD-10-CM

## 2013-04-05 DIAGNOSIS — J309 Allergic rhinitis, unspecified: Secondary | ICD-10-CM

## 2013-04-05 MED ORDER — FLUTICASONE PROPIONATE 50 MCG/ACT NA SUSP: 1.0000 | Freq: Every day | NASAL | Status: DC

## 2013-04-05 NOTE — Assessment & Plan Note (Signed)
Reviewed #s with patient.  Adequate.  Continue to work on HDL through aerobic exercise.

## 2013-04-05 NOTE — Addendum Note (Signed)
Addended by: Josph Macho A on: 04/05/2013 12:20 PM   Modules accepted: Orders

## 2013-04-05 NOTE — Patient Instructions (Addendum)
Flu shot today. Come back as needed or in 1-2 years for next physical. If persistent fistula flares, return to see Dr. Byrd Hesselbach. Good to see you today, call us with questions

## 2013-04-05 NOTE — Assessment & Plan Note (Signed)
Alternates claritin with allegra.  Persistent rhinorrhea. Interested in trial of INS - discussed use.

## 2013-04-05 NOTE — Progress Notes (Signed)
Pre-visit discussion using our clinic review tool. No additional management support is needed unless otherwise documented below in the visit note.  

## 2013-04-05 NOTE — Assessment & Plan Note (Signed)
Persistent flares - discussed concern with continued abx course to treat.  Pt will return to Dr. Mirian Mo at [facility] if recurs for further discussion of surgery.

## 2013-04-05 NOTE — Assessment & Plan Note (Signed)
Preventative protocols reviewed and updated unless pt declined. Discussed healthy diet and lifestyle.  

## 2013-04-05 NOTE — Progress Notes (Signed)
Subjective:    Patient ID: Wayne Smith, male    DOB: June 07, 1970, 42 y.o.   MRN: [id number]  HPI CC: CPE  H/o perirectal fistula s/p 2 unsuccessful fistulectomies.  Followed by Dr. Byrd Hesselbach at [facility]. Last saw Dr. Byrd Hesselbach in January 2014.  Over last 3 months has had recurrent flares treated with doxycycline course.  Another flare started 5d ago.  Started taking doxycycline 100mg  bid 5d ago.  Flare has resolved.  Preventative:  Td 2007  Flu shot - today  Caffeine: 2 cups coffee, sweet tea daily  Lives with GF, dogs and cats  Occupation: mechanical  Activity: stays active at home and work - [place] acres, lots of walking dogs Diet: good water, fruits/vegetables daily, red meat once a week  Medications and allergies reviewed and updated in chart.  Past histories reviewed and updated if relevant as below. Patient Active Problem List   Diagnosis Date Noted  . Acute bronchitis 06/12/2012  . Lumbar strain 12/23/2011  . Dyslipidemia 12/10/2011  . Axillary abscess 05/02/2011  . Perianal fistula 11/12/2010  . Healthcare maintenance 09/10/2010  . HERPES LABIALIS 03/27/2009  . ALLERGIC RHINITIS, CHRONIC 03/27/2009   Past Medical History  Diagnosis Date  . Allergic rhinitis, cause unspecified   . Herpes simplex without mention of complication   . Perirectal fistula 2012   Past Surgical History  Procedure Laterality Date  . Chest x-ray  10/29/92    Negative  . Anal fistulectomy  01/12/11    x2   History  Substance Use Topics  . Smoking status: Never Smoker   . Smokeless tobacco: Never Used  . Alcohol Use: Yes     Comment: Occasional   Family History  Problem Relation Age of Onset  . Stroke Mother 66  . Diabetes Maternal Grandmother   . Cancer Neg Hx   . Coronary artery disease Neg Hx   . Heart disease Neg Hx   . Crohn's disease Neg Hx    No Known Allergies Current Outpatient Prescriptions on File Prior to Visit  Medication Sig Dispense Refill  . Ascorbic Acid (VITAMIN C)  1000 MG tablet Take 1,000 mg by mouth daily.        . famciclovir (FAMVIR) 500 MG tablet TAKE ONE TABLET BY MOUTH THREE TIMES DAILY AS NEEDED AS DIRECTED  21 tablet  3  . fexofenadine (ALLEGRA) 180 MG tablet Take 180 mg by mouth daily.      . Multiple Vitamin (MULTIVITAMIN) tablet Take 1 tablet by mouth daily.        . cetirizine (ZYRTEC ALLERGY) 10 MG tablet Take 10 mg by mouth daily.         No current facility-administered medications on file prior to visit.     Review of Systems  Constitutional: Negative for fever, chills, activity change, appetite change, fatigue and unexpected weight change.  HENT: Negative for hearing loss.   Eyes: Negative for visual disturbance.  Respiratory: Negative for cough, chest tightness and shortness of breath.   Cardiovascular: Negative for chest pain, palpitations and leg swelling.  Gastrointestinal: Negative for nausea, vomiting, abdominal pain, diarrhea, constipation, blood in stool and abdominal distention.  Genitourinary: Negative for hematuria and difficulty urinating.  Musculoskeletal: Negative for arthralgias, myalgias and neck pain.  Skin: Negative for rash.  Neurological: Negative for dizziness, seizures, syncope and headaches.  Hematological: Negative for adenopathy. Does not bruise/bleed easily.  Psychiatric/Behavioral: Negative for dysphoric mood. The patient is not nervous/anxious.        Objective:  Physical Exam  Nursing note and vitals reviewed. Constitutional: He is oriented to person, place, and time. He appears well-developed and well-nourished. No distress.  HENT:  Head: Normocephalic and atraumatic.  Mouth/Throat: Oropharynx is clear and moist. No oropharyngeal exudate.  Eyes: Conjunctivae and EOM are normal. Pupils are equal, round, and reactive to light. No scleral icterus.  Neck: Normal range of motion. Neck supple. No thyromegaly present.  Cardiovascular: Normal rate, regular rhythm, normal heart sounds and intact distal  pulses.   No murmur heard. Pulses:      Radial pulses are 2+ on the right side, and 2+ on the left side.  Pulmonary/Chest: Effort normal and breath sounds normal. No respiratory distress. He has no wheezes. He has no rales.  Abdominal: Soft. Bowel sounds are normal. He exhibits no distension and no mass. There is no tenderness. There is no rebound and no guarding.  Genitourinary:  Scar at left buttock perianally, but no active fistula, fluctuance or indurated nodule appreciated today.  Musculoskeletal: Normal range of motion. He exhibits no edema.  Lymphadenopathy:    He has no cervical adenopathy.  Neurological: He is alert and oriented to person, place, and time.  CN grossly intact, station and gait intact  Skin: Skin is warm and dry. No rash noted.  Psychiatric: He has a normal mood and affect. His behavior is normal. Judgment and thought content normal.       Assessment & Plan:

## 2013-05-31 ENCOUNTER — Other Ambulatory Visit: Payer: Self-pay | Admitting: Family Medicine

## 2013-06-12 ENCOUNTER — Ambulatory Visit (INDEPENDENT_AMBULATORY_CARE_PROVIDER_SITE_OTHER): Payer: 59 | Admitting: Family Medicine

## 2013-06-12 ENCOUNTER — Encounter: Payer: Self-pay | Admitting: Family Medicine

## 2013-06-12 VITALS — BP 118/84 | HR 88 | Temp 98.5°F | Wt 187.5 lb

## 2013-06-12 DIAGNOSIS — J019 Acute sinusitis, unspecified: Secondary | ICD-10-CM | POA: Insufficient documentation

## 2013-06-12 MED ORDER — AMOXICILLIN 875 MG PO TABS: 875.0000 mg | ORAL_TABLET | Freq: Two times a day (BID) | ORAL | Status: DC

## 2013-06-12 MED ORDER — AZITHROMYCIN 250 MG PO TABS: ORAL_TABLET | ORAL | Status: DC

## 2013-06-12 NOTE — Progress Notes (Signed)
Pre-visit discussion using our clinic review tool. No additional management support is needed unless otherwise documented below in the visit note.  

## 2013-06-12 NOTE — Assessment & Plan Note (Signed)
Possibly developing into bronchitis.  H/o similar event last year. Will providezpack and recommended ibuprofen for sinus /bronchial inflammation. Update if sxs persist or fail to improve.

## 2013-06-12 NOTE — Patient Instructions (Addendum)
I think you do have a head cold. Push fluids and plenty of rest. Nasal saline irrigation or neti pot to help drain sinuses. May use plain mucinex or immediate release guaifenesin with plenty of fluid to help mobilize mucous. Take medicine as prescribed: zpack. Start ibuprofen 600mg  twice a day with meals. Let us know if fever >101.5, trouble opening/closing mouth, difficulty swallowing, or worsening - you may need to be seen again.

## 2013-06-12 NOTE — Progress Notes (Signed)
   Subjective:    Patient ID: Wayne Smith, male    DOB: 11/26/1970, 43 y.o.   MRN: [id number]  HPI CC:  Nasal congestion  6d h/o nasal congestion and drainage.  2d ago persistent rhinorrhea.  Allegra helped with this.  Now feels this is spreading into chest.  Felt feverish 2 days ago.  + PNdrainage.  Facial pressure/discomfort.  Coughing up sputum in mornings as well with deep rattly cough and sore chest.  No fevers/chills, ear or tooth pain, abd pain, ST, HA.  No wheezing or shortness of breath.  Treated with tylenol cold/sinus over weekend. Continues flonase. Tried prior prescription cough medicine from last year. No sick contacts at home. No smokers at home. Did receive flu shot this year.  Past Medical History  Diagnosis Date  . Allergic rhinitis, cause unspecified   . Herpes simplex without mention of complication   . Perirectal fistula 2012    Review of Systems Per HPI    Objective:   Physical Exam  Nursing note and vitals reviewed. Constitutional: He appears well-developed and well-nourished. No distress.  HENT:  Head: Normocephalic and atraumatic.  Right Ear: Hearing, tympanic membrane, external ear and ear canal normal.  Left Ear: Hearing, tympanic membrane, external ear and ear canal normal.  Nose: Mucosal edema and rhinorrhea present. Right sinus exhibits no maxillary sinus tenderness and no frontal sinus tenderness. Left sinus exhibits no maxillary sinus tenderness and no frontal sinus tenderness.  Mouth/Throat: Uvula is midline, oropharynx is clear and moist and mucous membranes are normal. No oropharyngeal exudate, posterior oropharyngeal edema, posterior oropharyngeal erythema or tonsillar abscesses.  Eyes: Conjunctivae and EOM are normal. Pupils are equal, round, and reactive to light. No scleral icterus.  Neck: Normal range of motion. Neck supple.  Cardiovascular: Normal rate, regular rhythm, normal heart sounds and intact distal pulses.   No murmur  heard. Pulmonary/Chest: Effort normal and breath sounds normal. No respiratory distress. He has no wheezes. He has no rales.  Lymphadenopathy:    He has no cervical adenopathy.  Skin: Skin is warm and dry. No rash noted.      Assessment & Plan:

## 2014-07-13 ENCOUNTER — Other Ambulatory Visit: Payer: Self-pay | Admitting: Family Medicine

## 2014-07-13 ENCOUNTER — Telehealth: Payer: Self-pay | Admitting: Family Medicine

## 2014-07-13 DIAGNOSIS — K603 Anal fistula: Secondary | ICD-10-CM

## 2014-07-13 DIAGNOSIS — E785 Hyperlipidemia, unspecified: Secondary | ICD-10-CM

## 2014-07-13 NOTE — Telephone Encounter (Signed)
-----   Message from Ellamae Sia sent at 07/09/2014  6:08 PM EST ----- Regarding: lab orders for Wednesday, 2.24.16 Patient is scheduled for CPX labs, please order future labs, Thanks , Terri  Pt. Requested to be tested for Lyme disease

## 2014-07-13 NOTE — Telephone Encounter (Signed)
plz check with pt - need reason for wanting testing for lyme disease to see if it will be covered by insurance.

## 2014-07-14 NOTE — Telephone Encounter (Signed)
Message left for patient to return my call.  

## 2014-07-15 NOTE — Telephone Encounter (Signed)
Spoke with patient. He said he had gotten into some ticks last year and just wanted to be checked. His dogs and cats have all tested positive for some sort of tick illness. He currently doesn't have any symptoms, so he is going to just hold off and talk to you more at his CPE to see if any testing is warranted.

## 2014-07-16 ENCOUNTER — Other Ambulatory Visit (INDEPENDENT_AMBULATORY_CARE_PROVIDER_SITE_OTHER): Payer: 59

## 2014-07-16 DIAGNOSIS — E785 Hyperlipidemia, unspecified: Secondary | ICD-10-CM

## 2014-07-16 DIAGNOSIS — K603 Anal fistula: Secondary | ICD-10-CM

## 2014-07-16 LAB — BASIC METABOLIC PANEL
BUN: 15 mg/dL (ref 6–23)
CALCIUM: 9.1 mg/dL (ref 8.4–10.5)
CO2: 29 mEq/L (ref 19–32)
Chloride: 107 mEq/L (ref 96–112)
Creatinine, Ser: 0.84 mg/dL (ref 0.40–1.50)
GFR: 105.1 mL/min (ref >60.00)
GLUCOSE: 92 mg/dL (ref 70–99)
Potassium: 4.2 mEq/L (ref 3.5–5.1)
Sodium: 140 mEq/L (ref 135–145)

## 2014-07-16 LAB — LIPID PANEL
CHOL/HDL RATIO: 4
Cholesterol: 171 mg/dL (ref 0–200)
HDL: 39 mg/dL — ABNORMAL LOW (ref >39.00)
LDL Cholesterol: 111 mg/dL — ABNORMAL HIGH (ref 0–99)
NonHDL: 132
Triglycerides: 107 mg/dL (ref 0.0–149.0)
VLDL: 21.4 mg/dL (ref 0.0–40.0)

## 2014-07-16 LAB — CBC WITH DIFFERENTIAL/PLATELET
Basophils Absolute: 0 10*3/uL (ref 0.0–0.1)
Basophils Relative: 0.8 % (ref 0.0–3.0)
EOS PCT: 3.7 % (ref 0.0–5.0)
Eosinophils Absolute: 0.2 10*3/uL (ref 0.0–0.7)
HCT: 48.3 % (ref 39.0–52.0)
HEMOGLOBIN: 16.8 g/dL (ref 13.0–17.0)
Lymphocytes Relative: 31.5 % (ref 12.0–46.0)
Lymphs Abs: 2 10*3/uL (ref 0.7–4.0)
MCHC: 34.8 g/dL (ref 30.0–36.0)
MCV: 86.1 fl (ref 78.0–100.0)
MONOS PCT: 7.1 % (ref 3.0–12.0)
Monocytes Absolute: 0.5 10*3/uL (ref 0.1–1.0)
NEUTROS ABS: 3.6 10*3/uL (ref 1.4–7.7)
NEUTROS PCT: 56.9 % (ref 43.0–77.0)
Platelets: 202 10*3/uL (ref 150.0–400.0)
RBC: 5.61 Mil/uL (ref 4.22–5.81)
RDW: 12.9 % (ref 11.5–15.5)
WBC: 6.4 10*3/uL (ref 4.0–10.5)

## 2014-07-21 ENCOUNTER — Ambulatory Visit (INDEPENDENT_AMBULATORY_CARE_PROVIDER_SITE_OTHER): Payer: 59 | Admitting: Family Medicine

## 2014-07-21 ENCOUNTER — Encounter: Payer: Self-pay | Admitting: Family Medicine

## 2014-07-21 VITALS — BP 118/70 | HR 100 | Temp 98.3°F | Ht 69.0 in | Wt 184.8 lb

## 2014-07-21 DIAGNOSIS — E785 Hyperlipidemia, unspecified: Secondary | ICD-10-CM

## 2014-07-21 DIAGNOSIS — K603 Anal fistula: Secondary | ICD-10-CM

## 2014-07-21 DIAGNOSIS — E663 Overweight: Secondary | ICD-10-CM

## 2014-07-21 DIAGNOSIS — E669 Obesity, unspecified: Secondary | ICD-10-CM | POA: Insufficient documentation

## 2014-07-21 DIAGNOSIS — Z0000 Encounter for general adult medical examination without abnormal findings: Secondary | ICD-10-CM

## 2014-07-21 NOTE — Progress Notes (Signed)
Pre visit review using our clinic review tool, if applicable. No additional management support is needed unless otherwise documented below in the visit note. 

## 2014-07-21 NOTE — Assessment & Plan Note (Signed)
Encouraged decreased sweet tea, increased water.

## 2014-07-21 NOTE — Assessment & Plan Note (Addendum)
Mild off meds. Discussed diet changes to affect improvement in numbers

## 2014-07-21 NOTE — Patient Instructions (Addendum)
Vision screen today. You are doing well. Increase water.  Return as needed or in 1-2 years for next physical.

## 2014-07-21 NOTE — Progress Notes (Signed)
BP 118/70 mmHg  Pulse 100  Temp(Src) 98.3 F (36.8 C) (Oral)  Ht 5\' 9"  (1.753 m)  Wt 184 lb 12 oz (83.802 kg)  BMI 27.27 kg/m2   CC: CPE  Subjective:    Patient ID: Wayne Smith, male    DOB: [birth year], 44 y.o.   MRN: [id number]  HPI: Wayne Smith is a 44 y.o. male presenting on 07/21/2014 for Annual Exam   H/o perirectal fistula s/p 2 unsuccessful fistulectomies. Followed by Dr. Morton Stall at [facility] for acne - treated with occasional doxycycline, none recently.  Preventative: Prostate cancer - does have fmhx as father age 61yo with dx Td 2007  Flu shot - at work  Caffeine: 2 cups coffee, sweet tea daily  Lives with GF, dogs and cats  Occupation: mechanical  Activity: stays active at home and work - [place], lots of walking dogs Diet: some water, fruits/vegetables daily, red meat once a week  Relevant past medical, surgical, family and social history reviewed and updated as indicated. Interim medical history since our last visit reviewed. Allergies and medications reviewed and updated. Current Outpatient Prescriptions on File Prior to Visit  Medication Sig  . Ascorbic Acid (VITAMIN C) 1000 MG tablet Take 1,000 mg by mouth daily.    . Multiple Vitamin (MULTIVITAMIN) tablet Take 1 tablet by mouth daily.    Marland Kitchen triamcinolone lotion (KENALOG) 0.1 % Apply 1 application topically daily.  . famciclovir (FAMVIR) 500 MG tablet TAKE ONE TABLET BY MOUTH THREE TIMES DAILY AS NEEDED AS DIRECTED (Patient not taking: Reported on 07/21/2014)  . fluticasone (FLONASE) 50 MCG/ACT nasal spray Place 1-2 sprays into both nostrils daily. (Patient not taking: Reported on 07/21/2014)   No current facility-administered medications on file prior to visit.    Review of Systems  Constitutional: Negative for fever, chills, activity change, appetite change, fatigue and unexpected weight change.  HENT: Negative for hearing loss.   Eyes: Positive for visual disturbance (trouble with  distance recently).  Respiratory: Negative for cough, chest tightness, shortness of breath and wheezing.   Cardiovascular: Negative for chest pain, palpitations and leg swelling.  Gastrointestinal: Negative for nausea, vomiting, abdominal pain, diarrhea, constipation, blood in stool and abdominal distention.  Genitourinary: Negative for hematuria and difficulty urinating.  Musculoskeletal: Negative for myalgias, arthralgias and neck pain.  Skin: Negative for rash.  Neurological: Negative for dizziness, seizures, syncope and headaches.  Hematological: Negative for adenopathy. Does not bruise/bleed easily.  Psychiatric/Behavioral: Negative for dysphoric mood. The patient is not nervous/anxious.    Per HPI unless specifically indicated above     Objective:    BP 118/70 mmHg  Pulse 100  Temp(Src) 98.3 F (36.8 C) (Oral)  Ht 5\' 9"  (1.753 m)  Wt 184 lb 12 oz (83.802 kg)  BMI 27.27 kg/m2  Wt Readings from Last 3 Encounters:  07/21/14 184 lb 12 oz (83.802 kg)  06/12/13 187 lb 8 oz (85.049 kg)  04/05/13 187 lb 8 oz (85.049 kg)    Physical Exam  Constitutional: He is oriented to person, place, and time. He appears well-developed and well-nourished. No distress.  HENT:  Head: Normocephalic and atraumatic.  Right Ear: Hearing, tympanic membrane, external ear and ear canal normal.  Left Ear: Hearing, tympanic membrane, external ear and ear canal normal.  Nose: Nose normal.  Mouth/Throat: Uvula is midline, oropharynx is clear and moist and mucous membranes are normal. No oropharyngeal exudate, posterior oropharyngeal edema or posterior oropharyngeal erythema.  Eyes: Conjunctivae  and EOM are normal. Pupils are equal, round, and reactive to light. No scleral icterus.  Neck: Normal range of motion. Neck supple. No thyromegaly present.  Cardiovascular: Normal rate, regular rhythm, normal heart sounds and intact distal pulses.   No murmur heard. Pulses:      Radial pulses are 2+ on the right  side, and 2+ on the left side.  Pulmonary/Chest: Effort normal and breath sounds normal. No respiratory distress. He has no wheezes. He has no rales.  Abdominal: Soft. Bowel sounds are normal. He exhibits no distension and no mass. There is no tenderness. There is no rebound and no guarding.  Musculoskeletal: Normal range of motion. He exhibits no edema.  Lymphadenopathy:    He has no cervical adenopathy.  Neurological: He is alert and oriented to person, place, and time.  CN grossly intact, station and gait intact  Skin: Skin is warm and dry. No rash noted.  Psychiatric: He has a normal mood and affect. His behavior is normal. Judgment and thought content normal.  Nursing note and vitals reviewed.  Results for orders placed or performed in visit on 07/16/14  Lipid panel  Result Value Ref Range   Cholesterol 171 0 - 200 mg/dL   Triglycerides 107.0 0.0 - 149.0 mg/dL   HDL 39.00 (L) >39.00 mg/dL   VLDL 21.4 0.0 - 40.0 mg/dL   LDL Cholesterol 111 (H) 0 - 99 mg/dL   Total CHOL/HDL Ratio 4    NonHDL 132.00   CBC with Differential/Platelet  Result Value Ref Range   WBC 6.4 4.0 - 10.5 K/uL   RBC 5.61 4.22 - 5.81 Mil/uL   Hemoglobin 16.8 13.0 - 17.0 g/dL   HCT 48.3 39.0 - 52.0 %   MCV 86.1 78.0 - 100.0 fl   MCHC 34.8 30.0 - 36.0 g/dL   RDW 12.9 11.5 - 15.5 %   Platelets 202.0 150.0 - 400.0 K/uL   Neutrophils Relative % 56.9 43.0 - 77.0 %   Lymphocytes Relative 31.5 12.0 - 46.0 %   Monocytes Relative 7.1 3.0 - 12.0 %   Eosinophils Relative 3.7 0.0 - 5.0 %   Basophils Relative 0.8 0.0 - 3.0 %   Neutro Abs 3.6 1.4 - 7.7 K/uL   Lymphs Abs 2.0 0.7 - 4.0 K/uL   Monocytes Absolute 0.5 0.1 - 1.0 K/uL   Eosinophils Absolute 0.2 0.0 - 0.7 K/uL   Basophils Absolute 0.0 0.0 - 0.1 K/uL  Basic metabolic panel  Result Value Ref Range   Sodium 140 135 - 145 mEq/L   Potassium 4.2 3.5 - 5.1 mEq/L   Chloride 107 96 - 112 mEq/L   CO2 29 19 - 32 mEq/L   Glucose, Bld 92 70 - 99 mg/dL   BUN 15 6 -  23 mg/dL   Creatinine, Ser 0.84 0.40 - 1.50 mg/dL   Calcium 9.1 8.4 - 10.5 mg/dL   GFR 105.10 >60.00 mL/min      Assessment & Plan:   Problem List Items Addressed This Visit    Perianal fistula    No recent flare.      Overweight    Encouraged decreased sweet tea, increased water.      Healthcare maintenance - Primary    Preventative protocols reviewed and updated unless pt declined. Discussed healthy diet and lifestyle.       Dyslipidemia    Mild off meds. Discussed diet changes to affect improvement in numbers          Follow up  plan: Return in about 2 years (around 07/20/2016), or as needed, for annual exam, prior fasting for blood work.

## 2014-07-21 NOTE — Assessment & Plan Note (Signed)
No recent flare.  

## 2014-07-21 NOTE — Assessment & Plan Note (Signed)
Preventative protocols reviewed and updated unless pt declined. Discussed healthy diet and lifestyle.  

## 2015-03-10 ENCOUNTER — Ambulatory Visit (INDEPENDENT_AMBULATORY_CARE_PROVIDER_SITE_OTHER): Payer: 59 | Admitting: Family Medicine

## 2015-03-10 ENCOUNTER — Encounter: Payer: Self-pay | Admitting: Family Medicine

## 2015-03-10 VITALS — BP 118/76 | HR 88 | Temp 98.4°F | Wt 188.0 lb

## 2015-03-10 DIAGNOSIS — M722 Plantar fascial fibromatosis: Secondary | ICD-10-CM

## 2015-03-10 NOTE — Patient Instructions (Signed)
You have plantar fasciitis - chronic condition that can take 6-12 months to heal. Continue advil as needed, frozen water bottle massage, stretches provided today, look into heel cushion insert into shoe.   Plantar Fasciitis Plantar fasciitis is a painful foot condition that affects the heel. It occurs when the band of tissue that connects the toes to the heel bone (plantar fascia) becomes irritated. This can happen after exercising too much or doing other repetitive activities (overuse injury). The pain from plantar fasciitis can range from mild irritation to severe pain that makes it difficult for you to walk or move. The pain is usually worse in the morning or after you have been sitting or lying down for a while. CAUSES This condition may be caused by:  Standing for long periods of time.  Wearing shoes that do not fit.  Doing high-impact activities, including running, aerobics, and ballet.  Being overweight.  Having an abnormal way of walking (gait).  Having tight calf muscles.  Having high arches in your feet.  Starting a new athletic activity. SYMPTOMS The main symptom of this condition is heel pain. Other symptoms include:  Pain that gets worse after activity or exercise.  Pain that is worse in the morning or after resting.  Pain that goes away after you walk for a few minutes. DIAGNOSIS This condition may be diagnosed based on your signs and symptoms. Your health care provider will also do a physical exam to check for:  A tender area on the bottom of your foot.  A high arch in your foot.  Pain when you move your foot.  Difficulty moving your foot. You may also need to have imaging studies to confirm the diagnosis. These can include:  X-rays.  Ultrasound.  MRI. TREATMENT  Treatment for plantar fasciitis depends on the severity of the condition. Your treatment may include:  Rest, ice, and over-the-counter pain medicines to manage your pain.  Exercises to  stretch your calves and your plantar fascia.  A splint that holds your foot in a stretched, upward position while you sleep (night splint).  Physical therapy to relieve symptoms and prevent problems in the future.  Cortisone injections to relieve severe pain.  Extracorporeal shock wave therapy (ESWT) to stimulate damaged plantar fascia with electrical impulses. It is often used as a last resort before surgery.  Surgery, if other treatments have not worked after 12 months. HOME CARE INSTRUCTIONS  Take medicines only as directed by your health care provider.  Avoid activities that cause pain.  Roll the bottom of your foot over a bag of ice or a bottle of cold water. Do this for 20 minutes, 3-4 times a day.  Perform simple stretches as directed by your health care provider.  Try wearing athletic shoes with air-sole or gel-sole cushions or soft shoe inserts.  Wear a night splint while sleeping, if directed by your health care provider.  Keep all follow-up appointments with your health care provider. PREVENTION   Do not perform exercises or activities that cause heel pain.  Consider finding low-impact activities if you continue to have problems.  Lose weight if you need to. The best way to prevent plantar fasciitis is to avoid the activities that aggravate your plantar fascia. SEEK MEDICAL CARE IF:  Your symptoms do not go away after treatment with home care measures.  Your pain gets worse.  Your pain affects your ability to move or do your daily activities.   This information is not intended to replace  advice given to you by your health care provider. Make sure you discuss any questions you have with your health care provider.   Document Released: 02/01/2001 Document Revised: 01/28/2015 Document Reviewed: 03/19/2014 Elsevier Interactive Patient Education [employer].

## 2015-03-10 NOTE — Progress Notes (Signed)
Pre visit review using our clinic review tool, if applicable. No additional management support is needed unless otherwise documented below in the visit note. 

## 2015-03-10 NOTE — Progress Notes (Signed)
   BP 118/76 mmHg  Pulse 88  Temp(Src) 98.4 F (36.9 C) (Oral)  Wt 188 lb (85.276 kg)   CC: L heel pain  Subjective:    Patient ID: Wayne Smith, male    DOB: February 09, 1971, 44 y.o.   MRN: [id number]  HPI: GAL FELDHAUS is a 44 y.o. male presenting on 03/10/2015 for Foot Pain   L heel pain ongoing for last 5 months. So far has tried frozen water bottle which helps some. Hasn't tried anything other than advil. Denies inciting trauma/falls or injury. Works on concrete all day - worse first few steps of day - describes numbness.  Also some pain at end of day. Some improvement has been noted. Changed shoes to new balance tennis shoes instead of boots, dr scholl insoles as well. Points to sole of left heel.  Denies weakness of feet, new rashes or redness/warmth.  Relevant past medical, surgical, family and social history reviewed and updated as indicated. Interim medical history since our last visit reviewed. Allergies and medications reviewed and updated. Current Outpatient Prescriptions on File Prior to Visit  Medication Sig  . Ascorbic Acid (VITAMIN C) 1000 MG tablet Take 1,000 mg by mouth daily.    . cetirizine (ZYRTEC) 10 MG tablet Take 10 mg by mouth daily.  . Multiple Vitamin (MULTIVITAMIN) tablet Take 1 tablet by mouth daily.    Marland Kitchen triamcinolone lotion (KENALOG) 0.1 % Apply 1 application topically daily.  . famciclovir (FAMVIR) 500 MG tablet TAKE ONE TABLET BY MOUTH THREE TIMES DAILY AS NEEDED AS DIRECTED (Patient not taking: Reported on 07/21/2014)  . fluticasone (FLONASE) 50 MCG/ACT nasal spray Place 1-2 sprays into both nostrils daily. (Patient not taking: Reported on 07/21/2014)   No current facility-administered medications on file prior to visit.    Review of Systems Per HPI unless specifically indicated above     Objective:    BP 118/76 mmHg  Pulse 88  Temp(Src) 98.4 F (36.9 C) (Oral)  Wt 188 lb (85.276 kg)  Wt Readings from Last 3 Encounters:  03/10/15 188 lb (85.276  kg)  07/21/14 184 lb 12 oz (83.802 kg)  06/12/13 187 lb 8 oz (85.049 kg)    Physical Exam  Constitutional: He is oriented to person, place, and time. He appears well-developed and well-nourished. No distress.  Musculoskeletal: He exhibits no edema.  2+ DP bilaterally FROM at ankles, no pain at malleoli or feet. Tender to palpation L posterior medial heel sole  Neurological: He is alert and oriented to person, place, and time.  Sensation intact  Nursing note and vitals reviewed.     Assessment & Plan:   Problem List Items Addressed This Visit    Plantar fasciitis of left foot - Primary    Discussed dx and etiology and management. chronic condition, should improve over next few months. rec NSAID prn, ice bath or frozen water bottle massage, stretching exercises provided today, heel lift. Update if not improving with treatment as expected. Pt agrees with plan.          Follow up plan: Return if symptoms worsen or fail to improve.

## 2015-03-10 NOTE — Assessment & Plan Note (Signed)
Discussed dx and etiology and management. chronic condition, should improve over next few months. rec NSAID prn, ice bath or frozen water bottle massage, stretching exercises provided today, heel lift. Update if not improving with treatment as expected. Pt agrees with plan.

## 2015-12-30 ENCOUNTER — Other Ambulatory Visit: Payer: Self-pay | Admitting: Family Medicine

## 2016-05-04 ENCOUNTER — Telehealth: Payer: Self-pay

## 2016-05-04 NOTE — Telephone Encounter (Signed)
Pt left v/m requesting refill of a med; pt did not leave name of med or pharmacy. Left v/m requesting pt to cb. Last annual exam on 07/21/14 and future CPX scheduled on 05/17/16.

## 2016-05-06 ENCOUNTER — Other Ambulatory Visit: Payer: Self-pay

## 2016-05-06 MED ORDER — TRIAMCINOLONE ACETONIDE 0.1 % EX LOTN: 1.0000 "application " | TOPICAL_LOTION | Freq: Every day | CUTANEOUS | 1 refills | Status: DC

## 2016-05-06 NOTE — Telephone Encounter (Signed)
Pt said when saw Dr Darnell Level 07/21/14 for annual pt asked if Dr Darnell Level would fill triamcinolone lotion when needed for scalp because pt does not routinely see dermatologist now. Pt request refill to [facility].Please advPt has CPX scheduled for 05/17/16.

## 2016-05-06 NOTE — Telephone Encounter (Signed)
Sent in

## 2016-05-08 ENCOUNTER — Other Ambulatory Visit: Payer: Self-pay | Admitting: Family Medicine

## 2016-05-08 DIAGNOSIS — E785 Hyperlipidemia, unspecified: Secondary | ICD-10-CM

## 2016-05-09 ENCOUNTER — Other Ambulatory Visit (INDEPENDENT_AMBULATORY_CARE_PROVIDER_SITE_OTHER): Payer: 59

## 2016-05-09 DIAGNOSIS — E785 Hyperlipidemia, unspecified: Secondary | ICD-10-CM

## 2016-05-09 LAB — LIPID PANEL
CHOLESTEROL: 159 mg/dL (ref 0–200)
HDL: 36.7 mg/dL — AB (ref >39.00)
LDL Cholesterol: 103 mg/dL — ABNORMAL HIGH (ref 0–99)
NONHDL: 121.85
Total CHOL/HDL Ratio: 4
Triglycerides: 93 mg/dL (ref 0.0–149.0)
VLDL: 18.6 mg/dL (ref 0.0–40.0)

## 2016-05-09 LAB — BASIC METABOLIC PANEL
BUN: 12 mg/dL (ref 6–23)
CALCIUM: 9 mg/dL (ref 8.4–10.5)
CHLORIDE: 105 meq/L (ref 96–112)
CO2: 28 meq/L (ref 19–32)
CREATININE: 0.9 mg/dL (ref 0.40–1.50)
GFR: 96.28 mL/min (ref >60.00)
GLUCOSE: 87 mg/dL (ref 70–99)
Potassium: 4.5 mEq/L (ref 3.5–5.1)
SODIUM: 140 meq/L (ref 135–145)

## 2016-05-11 ENCOUNTER — Encounter: Payer: 59 | Admitting: Family Medicine

## 2016-05-17 ENCOUNTER — Encounter: Payer: Self-pay | Admitting: Family Medicine

## 2016-05-17 ENCOUNTER — Ambulatory Visit (INDEPENDENT_AMBULATORY_CARE_PROVIDER_SITE_OTHER): Payer: 59 | Admitting: Family Medicine

## 2016-05-17 VITALS — BP 126/82 | HR 96 | Temp 98.4°F | Ht 69.0 in | Wt 192.2 lb

## 2016-05-17 DIAGNOSIS — R011 Cardiac murmur, unspecified: Secondary | ICD-10-CM | POA: Insufficient documentation

## 2016-05-17 DIAGNOSIS — Z0000 Encounter for general adult medical examination without abnormal findings: Secondary | ICD-10-CM

## 2016-05-17 DIAGNOSIS — Z23 Encounter for immunization: Secondary | ICD-10-CM

## 2016-05-17 DIAGNOSIS — E785 Hyperlipidemia, unspecified: Secondary | ICD-10-CM | POA: Diagnosis not present

## 2016-05-17 NOTE — Assessment & Plan Note (Signed)
Chronic, mild off medications. Encouraged increased aerobic exercise.

## 2016-05-17 NOTE — Addendum Note (Signed)
Addended by: Royann Shivers A on: 05/17/2016 08:31 AM   Modules accepted: Orders

## 2016-05-17 NOTE — Patient Instructions (Addendum)
Flu shot today.  Return as needed or in 1 year for next physical.   Health Maintenance, Male A healthy lifestyle and preventative care can promote health and wellness.  Maintain regular health, dental, and eye exams.  Eat a healthy diet. Foods like vegetables, fruits, whole grains, low-fat dairy products, and lean protein foods contain the nutrients you need and are low in calories. Decrease your intake of foods high in solid fats, added sugars, and salt. Get information about a proper diet from your health care provider, if necessary.  Regular physical exercise is one of the most important things you can do for your health. Most adults should get at least 150 minutes of moderate-intensity exercise (any activity that increases your heart rate and causes you to sweat) each week. In addition, most adults need muscle-strengthening exercises on 2 or more days a week.   Maintain a healthy weight. The body mass index (BMI) is a screening tool to identify possible weight problems. It provides an estimate of body fat based on height and weight. Your health care provider can find your BMI and can help you achieve or maintain a healthy weight. For males 20 years and older:  A BMI below 18.5 is considered underweight.  A BMI of 18.5 to 24.9 is normal.  A BMI of 25 to 29.9 is considered overweight.  A BMI of 30 and above is considered obese.  Maintain normal blood lipids and cholesterol by exercising and minimizing your intake of saturated fat. Eat a balanced diet with plenty of fruits and vegetables. Blood tests for lipids and cholesterol should begin at age 10 and be repeated every 5 years. If your lipid or cholesterol levels are high, you are over age 10, or you are at high risk for heart disease, you may need your cholesterol levels checked more frequently.Ongoing high lipid and cholesterol levels should be treated with medicines if diet and exercise are not working.  If you smoke, find out from  your health care provider how to quit. If you do not use tobacco, do not start.  Lung cancer screening is recommended for adults aged 58-80 years who are at high risk for developing lung cancer because of a history of smoking. A yearly low-dose CT scan of the lungs is recommended for people who have at least a 30-pack-year history of smoking and are current smokers or have quit within the past 15 years. A pack year of smoking is smoking an average of 1 pack of cigarettes a day for 1 year (for example, a 30-pack-year history of smoking could mean smoking 1 pack a day for 30 years or 2 packs a day for 15 years). Yearly screening should continue until the smoker has stopped smoking for at least 15 years. Yearly screening should be stopped for people who develop a health problem that would prevent them from having lung cancer treatment.  If you choose to drink alcohol, do not have more than 2 drinks per day. One drink is considered to be 12 oz (360 mL) of beer, 5 oz (150 mL) of wine, or 1.5 oz (45 mL) of liquor.  Avoid the use of street drugs. Do not share needles with anyone. Ask for help if you need support or instructions about stopping the use of drugs.  High blood pressure causes heart disease and increases the risk of stroke. High blood pressure is more likely to develop in:  People who have blood pressure in the end of the normal range (100-139/85-89  mm Hg).  People who are overweight or obese.  People who are African American.  If you are 73-33 years of age, have your blood pressure checked every 3-5 years. If you are 47 years of age or older, have your blood pressure checked every year. You should have your blood pressure measured twice-once when you are at a hospital or clinic, and once when you are not at a hospital or clinic. Record the average of the two measurements. To check your blood pressure when you are not at a hospital or clinic, you can use:  An automated blood pressure machine at  a pharmacy.  A home blood pressure monitor.  If you are 36-66 years old, ask your health care provider if you should take aspirin to prevent heart disease.  Diabetes screening involves taking a blood sample to check your fasting blood sugar level. This should be done once every 3 years after age 77 if you are at a normal weight and without risk factors for diabetes. Testing should be considered at a younger age or be carried out more frequently if you are overweight and have at least 1 risk factor for diabetes.  Colorectal cancer can be detected and often prevented. Most routine colorectal cancer screening begins at the age of 67 and continues through age 22. However, your health care provider may recommend screening at an earlier age if you have risk factors for colon cancer. On a yearly basis, your health care provider may provide home test kits to check for hidden blood in the stool. A small camera at the end of a tube may be used to directly examine the colon (sigmoidoscopy or colonoscopy) to detect the earliest forms of colorectal cancer. Talk to your health care provider about this at age 21 when routine screening begins. A direct exam of the colon should be repeated every 5-10 years through age 47, unless early forms of precancerous polyps or small growths are found.  People who are at an increased risk for hepatitis B should be screened for this virus. You are considered at high risk for hepatitis B if:  You were born in a country where hepatitis B occurs often. Talk with your health care provider about which countries are considered high risk.  Your parents were born in a high-risk country and you have not received a shot to protect against hepatitis B (hepatitis B vaccine).  You have HIV or AIDS.  You use needles to inject street drugs.  You live with, or have sex with, someone who has hepatitis B.  You are a man who has sex with other men (MSM).  You get hemodialysis  treatment.  You take certain medicines for conditions like cancer, organ transplantation, and autoimmune conditions.  Hepatitis C blood testing is recommended for all people born from [birth year] through 1965 and any individual with known risk factors for hepatitis C.  Healthy men should no longer receive prostate-specific antigen (PSA) blood tests as part of routine cancer screening. Talk to your health care provider about prostate cancer screening.  Testicular cancer screening is not recommended for adolescents or adult males who have no symptoms. Screening includes self-exam, a health care provider exam, and other screening tests. Consult with your health care provider about any symptoms you have or any concerns you have about testicular cancer.  Practice safe sex. Use condoms and avoid high-risk sexual practices to reduce the spread of sexually transmitted infections (STIs).  You should be screened for STIs, including gonorrhea  and chlamydia if:  You are sexually active and are younger than 24 years.  You are older than 24 years, and your health care provider tells you that you are at risk for this type of infection.  Your sexual activity has changed since you were last screened, and you are at an increased risk for chlamydia or gonorrhea. Ask your health care provider if you are at risk.  If you are at risk of being infected with HIV, it is recommended that you take a prescription medicine daily to prevent HIV infection. This is called pre-exposure prophylaxis (PrEP). You are considered at risk if:  You are a man who has sex with other men (MSM).  You are a heterosexual man who is sexually active with multiple partners.  You take drugs by injection.  You are sexually active with a partner who has HIV.  Talk with your health care provider about whether you are at high risk of being infected with HIV. If you choose to begin PrEP, you should first be tested for HIV. You should then be tested  every 3 months for as long as you are taking PrEP.  Use sunscreen. Apply sunscreen liberally and repeatedly throughout the day. You should seek shade when your shadow is shorter than you. Protect yourself by wearing long sleeves, pants, a wide-brimmed hat, and sunglasses year round whenever you are outdoors.  Tell your health care provider of new moles or changes in moles, especially if there is a change in shape or color. Also, tell your health care provider if a mole is larger than the size of a pencil eraser.  A one-time screening for abdominal aortic aneurysm (AAA) and surgical repair of large AAAs by ultrasound is recommended for men aged 66-75 years who are current or former smokers.  Stay current with your vaccines (immunizations). This information is not intended to replace advice given to you by your health care provider. Make sure you discuss any questions you have with your health care provider. Document Released: 11/05/2007 Document Revised: 05/30/2014 Document Reviewed: 02/10/2015 Elsevier Interactive Patient Education  2017 [employer].

## 2016-05-17 NOTE — Assessment & Plan Note (Signed)
Preventative protocols reviewed and updated unless pt declined. Discussed healthy diet and lifestyle.  

## 2016-05-17 NOTE — Progress Notes (Signed)
Pre visit review using our clinic review tool, if applicable. No additional management support is needed unless otherwise documented below in the visit note. 

## 2016-05-17 NOTE — Assessment & Plan Note (Signed)
New. Asxs. Will monitor. Consider echo down the road

## 2016-05-17 NOTE — Progress Notes (Signed)
BP 126/82   Pulse 96   Temp 98.4 F (36.9 C) (Oral)   Ht 5\' 9"  (1.753 m)   Wt 192 lb 4 oz (87.2 kg)   BMI 28.39 kg/m    CC: CPE Subjective:    Patient ID: Wayne Smith, male    DOB: 10-12-1970, 45 y.o.   MRN: [id number]  HPI: Wayne Smith is a 45 y.o. male presenting on 05/17/2016 for Annual Exam   H/o perirectal fistula s/p 2 unsuccessful fistulectomies. Followed by Dr. Morton Stall at [facility]   No h/o rheumatic fever. Denies chest pain or dyspnea.   Preventative: Prostate cancer - does have fmhx as father age 29yo with dx - will start at age 80 Flu shot today Td 2007  Seat belt use discussed Sunscreen use discussed. No changing moles on skin. Non smoker Alcohol - some on weekends  Caffeine: 2 cups coffee, sweet tea daily  Lives with GF, dogs and cats  Occupation: Dealer  Activity: no regular exercise, [place], lots of walking dogs, active at work  Diet: some water, fruits/vegetables daily, red meat once a week   Relevant past medical, surgical, family and social history reviewed and updated as indicated. Interim medical history since our last visit reviewed. Allergies and medications reviewed and updated. Current Outpatient Prescriptions on File Prior to Visit  Medication Sig  . Ascorbic Acid (VITAMIN C) 1000 MG tablet Take 1,000 mg by mouth daily.    . cetirizine (ZYRTEC) 10 MG tablet Take 10 mg by mouth daily.  . famciclovir (FAMVIR) 500 MG tablet TAKE ONE TABLET BY MOUTH THREE TIMES DAILY AS NEEDED AS DIRECTED  . Multiple Vitamin (MULTIVITAMIN) tablet Take 1 tablet by mouth daily.    Marland Kitchen triamcinolone lotion (KENALOG) 0.1 % Apply 1 application topically daily.   No current facility-administered medications on file prior to visit.     Review of Systems  Constitutional: Negative for activity change, appetite change, chills, fatigue, fever and unexpected weight change.  HENT: Negative for hearing loss.   Eyes: Negative for visual disturbance.  Respiratory:  Negative for cough, chest tightness, shortness of breath and wheezing.   Cardiovascular: Negative for chest pain, palpitations and leg swelling.  Gastrointestinal: Negative for abdominal distention, abdominal pain, blood in stool, constipation, diarrhea, nausea and vomiting.  Genitourinary: Negative for difficulty urinating and hematuria.  Musculoskeletal: Negative for arthralgias, myalgias and neck pain.  Skin: Negative for rash.  Neurological: Negative for dizziness, seizures, syncope and headaches.  Hematological: Negative for adenopathy. Does not bruise/bleed easily.  Psychiatric/Behavioral: Negative for dysphoric mood. The patient is not nervous/anxious.    Per HPI unless specifically indicated in ROS section     Objective:    BP 126/82   Pulse 96   Temp 98.4 F (36.9 C) (Oral)   Ht 5\' 9"  (1.753 m)   Wt 192 lb 4 oz (87.2 kg)   BMI 28.39 kg/m   Wt Readings from Last 3 Encounters:  05/17/16 192 lb 4 oz (87.2 kg)  03/10/15 188 lb (85.3 kg)  07/21/14 184 lb 12 oz (83.8 kg)    Physical Exam  Constitutional: He is oriented to person, place, and time. He appears well-developed and well-nourished. No distress.  HENT:  Head: Normocephalic and atraumatic.  Right Ear: Hearing, tympanic membrane, external ear and ear canal normal.  Left Ear: Hearing, tympanic membrane, external ear and ear canal normal.  Nose: Nose normal.  Mouth/Throat: Uvula is midline, oropharynx is clear and moist and mucous membranes  are normal. No oropharyngeal exudate, posterior oropharyngeal edema or posterior oropharyngeal erythema.  Eyes: Conjunctivae and EOM are normal. Pupils are equal, round, and reactive to light. No scleral icterus.  Neck: Normal range of motion. Neck supple. No thyromegaly present.  Cardiovascular: Normal rate, regular rhythm and intact distal pulses.   Murmur (4/6 mid systolic best at apex) heard. Pulses:      Radial pulses are 2+ on the right side, and 2+ on the left side.    Pulmonary/Chest: Effort normal and breath sounds normal. No respiratory distress. He has no wheezes. He has no rales.  Abdominal: Soft. Bowel sounds are normal. He exhibits no distension and no mass. There is no tenderness. There is no rebound and no guarding.  Musculoskeletal: Normal range of motion. He exhibits no edema.  Lymphadenopathy:    He has no cervical adenopathy.  Neurological: He is alert and oriented to person, place, and time.  CN grossly intact, station and gait intact  Skin: Skin is warm and dry. No rash noted.  Psychiatric: He has a normal mood and affect. His behavior is normal. Judgment and thought content normal.  Nursing note and vitals reviewed.  Results for orders placed or performed in visit on 05/09/16  Lipid panel  Result Value Ref Range   Cholesterol 159 0 - 200 mg/dL   Triglycerides 93.0 0.0 - 149.0 mg/dL   HDL 36.70 (L) >39.00 mg/dL   VLDL 18.6 0.0 - 40.0 mg/dL   LDL Cholesterol 103 (H) 0 - 99 mg/dL   Total CHOL/HDL Ratio 4    NonHDL [id number]   Basic metabolic panel  Result Value Ref Range   Sodium 140 135 - 145 mEq/L   Potassium 4.5 3.5 - 5.1 mEq/L   Chloride 105 96 - 112 mEq/L   CO2 28 19 - 32 mEq/L   Glucose, Bld 87 70 - 99 mg/dL   BUN 12 6 - 23 mg/dL   Creatinine, Ser 0.90 0.40 - 1.50 mg/dL   Calcium 9.0 8.4 - 10.5 mg/dL   GFR 96.28 >60.00 mL/min      Assessment & Plan:   Problem List Items Addressed This Visit    Dyslipidemia    Chronic, mild off medications. Encouraged increased aerobic exercise.      Healthcare maintenance - Primary    Preventative protocols reviewed and updated unless pt declined. Discussed healthy diet and lifestyle.       Systolic murmur    New. Asxs. Will monitor. Consider echo down the road           Follow up plan: Return in about 1 year (around 05/17/2017) for annual exam, prior fasting for blood work.  Ria Bush, MD

## 2017-06-27 ENCOUNTER — Other Ambulatory Visit (INDEPENDENT_AMBULATORY_CARE_PROVIDER_SITE_OTHER): Payer: 59

## 2017-06-27 ENCOUNTER — Other Ambulatory Visit: Payer: Self-pay | Admitting: Family Medicine

## 2017-06-27 ENCOUNTER — Encounter: Payer: Self-pay | Admitting: Family Medicine

## 2017-06-27 DIAGNOSIS — E785 Hyperlipidemia, unspecified: Secondary | ICD-10-CM | POA: Diagnosis not present

## 2017-06-27 DIAGNOSIS — Z8042 Family history of malignant neoplasm of prostate: Secondary | ICD-10-CM

## 2017-06-27 LAB — COMPREHENSIVE METABOLIC PANEL
ALT: 48 U/L (ref 0–53)
AST: 28 U/L (ref 0–37)
Albumin: 4.4 g/dL (ref 3.5–5.2)
Alkaline Phosphatase: 57 U/L (ref 39–117)
BILIRUBIN TOTAL: 0.6 mg/dL (ref 0.2–1.2)
BUN: 14 mg/dL (ref 6–23)
CO2: 30 mEq/L (ref 19–32)
Calcium: 9.4 mg/dL (ref 8.4–10.5)
Chloride: 105 mEq/L (ref 96–112)
Creatinine, Ser: 0.87 mg/dL (ref 0.40–1.50)
GFR: 99.63 mL/min (ref >60.00)
GLUCOSE: 97 mg/dL (ref 70–99)
Potassium: 5 mEq/L (ref 3.5–5.1)
SODIUM: 141 meq/L (ref 135–145)
Total Protein: 6.8 g/dL (ref 6.0–8.3)

## 2017-06-27 LAB — LIPID PANEL
CHOL/HDL RATIO: 4
Cholesterol: 209 mg/dL — ABNORMAL HIGH (ref 0–200)
HDL: 47.3 mg/dL (ref >39.00)
LDL Cholesterol: 134 mg/dL — ABNORMAL HIGH (ref 0–99)
NONHDL: 161.55
Triglycerides: 140 mg/dL (ref 0.0–149.0)
VLDL: 28 mg/dL (ref 0.0–40.0)

## 2017-06-27 LAB — PSA: PSA: 1.47 ng/mL (ref 0.10–4.00)

## 2017-06-28 ENCOUNTER — Other Ambulatory Visit: Payer: Self-pay | Admitting: Family Medicine

## 2017-07-03 ENCOUNTER — Ambulatory Visit (INDEPENDENT_AMBULATORY_CARE_PROVIDER_SITE_OTHER): Payer: 59 | Admitting: Family Medicine

## 2017-07-03 ENCOUNTER — Encounter: Payer: Self-pay | Admitting: Family Medicine

## 2017-07-03 VITALS — BP 136/78 | HR 87 | Temp 98.5°F | Ht 67.5 in | Wt 193.5 lb

## 2017-07-03 DIAGNOSIS — Z23 Encounter for immunization: Secondary | ICD-10-CM

## 2017-07-03 DIAGNOSIS — R011 Cardiac murmur, unspecified: Secondary | ICD-10-CM | POA: Diagnosis not present

## 2017-07-03 DIAGNOSIS — Z0000 Encounter for general adult medical examination without abnormal findings: Secondary | ICD-10-CM | POA: Diagnosis not present

## 2017-07-03 DIAGNOSIS — E785 Hyperlipidemia, unspecified: Secondary | ICD-10-CM

## 2017-07-03 MED ORDER — TRIAMCINOLONE ACETONIDE 0.1 % EX LOTN: 1.0000 "application " | TOPICAL_LOTION | Freq: Every day | CUTANEOUS | 1 refills | Status: DC

## 2017-07-03 NOTE — Assessment & Plan Note (Signed)
Preventative protocols reviewed and updated unless pt declined. Discussed healthy diet and lifestyle.  

## 2017-07-03 NOTE — Assessment & Plan Note (Addendum)
Chronic, stable off meds. Reviewed healthy diet choices to improve cholesterol levels.  The 10-year ASCVD risk score Mikey Bussing DC Brooke Bonito., et al., 2013) is: 3.2%   Values used to calculate the score:     Age: 47 years     Sex: Male     Is Non-Hispanic African American: No     Diabetic: No     Tobacco smoker: No     Systolic Blood Pressure: 162 mmHg     Is BP treated: No     HDL Cholesterol: 47.3 mg/dL     Total Cholesterol: 209 mg/dL

## 2017-07-03 NOTE — Assessment & Plan Note (Signed)
Not appreciated today.  

## 2017-07-03 NOTE — Patient Instructions (Addendum)
Flu shot today. We will watch cholesterol levels - remember - more fruits and vegetables, more fish, less red meat and dairy products.  More soy, nuts, beans, barley, lentils, oats and plant sterol ester enriched margarine instead of butter. You are doing well today Return as needed or in 1 year for next physical.   Health Maintenance, Male A healthy lifestyle and preventive care is important for your health and wellness. Ask your health care provider about what schedule of regular examinations is right for you. What should I know about weight and diet? Eat a Healthy Diet  Eat plenty of vegetables, fruits, whole grains, low-fat dairy products, and lean protein.  Do not eat a lot of foods high in solid fats, added sugars, or salt.  Maintain a Healthy Weight Regular exercise can help you achieve or maintain a healthy weight. You should:  Do at least 150 minutes of exercise each week. The exercise should increase your heart rate and make you sweat (moderate-intensity exercise).  Do strength-training exercises at least twice a week.  Watch Your Levels of Cholesterol and Blood Lipids  Have your blood tested for lipids and cholesterol every 5 years starting at 48 years of age. If you are at high risk for heart disease, you should start having your blood tested when you are 48 years old. You may need to have your cholesterol levels checked more often if: ? Your lipid or cholesterol levels are high. ? You are older than 48 years of age. ? You are at high risk for heart disease.  What should I know about cancer screening? Many types of cancers can be detected early and may often be prevented. Lung Cancer  You should be screened every year for lung cancer if: ? You are a current smoker who has smoked for at least 30 years. ? You are a former smoker who has quit within the past 15 years.  Talk to your health care provider about your screening options, when you should start screening, and how  often you should be screened.  Colorectal Cancer  Routine colorectal cancer screening usually begins at 48 years of age and should be repeated every 5-10 years until you are 48 years old. You may need to be screened more often if early forms of precancerous polyps or small growths are found. Your health care provider may recommend screening at an earlier age if you have risk factors for colon cancer.  Your health care provider may recommend using home test kits to check for hidden blood in the stool.  A small camera at the end of a tube can be used to examine your colon (sigmoidoscopy or colonoscopy). This checks for the earliest forms of colorectal cancer.  Prostate and Testicular Cancer  Depending on your age and overall health, your health care provider may do certain tests to screen for prostate and testicular cancer.  Talk to your health care provider about any symptoms or concerns you have about testicular or prostate cancer.  Skin Cancer  Check your skin from head to toe regularly.  Tell your health care provider about any new moles or changes in moles, especially if: ? There is a change in a mole's size, shape, or color. ? You have a mole that is larger than a pencil eraser.  Always use sunscreen. Apply sunscreen liberally and repeat throughout the day.  Protect yourself by wearing long sleeves, pants, a wide-brimmed hat, and sunglasses when outside.  What should I know about heart  disease, diabetes, and high blood pressure?  If you are 53-14 years of age, have your blood pressure checked every 3-5 years. If you are 57 years of age or older, have your blood pressure checked every year. You should have your blood pressure measured twice-once when you are at a hospital or clinic, and once when you are not at a hospital or clinic. Record the average of the two measurements. To check your blood pressure when you are not at a hospital or clinic, you can use: ? An automated blood  pressure machine at a pharmacy. ? A home blood pressure monitor.  Talk to your health care provider about your target blood pressure.  If you are between 68-65 years old, ask your health care provider if you should take aspirin to prevent heart disease.  Have regular diabetes screenings by checking your fasting blood sugar level. ? If you are at a normal weight and have a low risk for diabetes, have this test once every three years after the age of 65. ? If you are overweight and have a high risk for diabetes, consider being tested at a younger age or more often.  A one-time screening for abdominal aortic aneurysm (AAA) by ultrasound is recommended for men aged 1-75 years who are current or former smokers. What should I know about preventing infection? Hepatitis B If you have a higher risk for hepatitis B, you should be screened for this virus. Talk with your health care provider to find out if you are at risk for hepatitis B infection. Hepatitis C Blood testing is recommended for:  Everyone born from [birth year] through 1966.  Anyone with known risk factors for hepatitis C.  Sexually Transmitted Diseases (STDs)  You should be screened each year for STDs including gonorrhea and chlamydia if: ? You are sexually active and are younger than 48 years of age. ? You are older than 48 years of age and your health care provider tells you that you are at risk for this type of infection. ? Your sexual activity has changed since you were last screened and you are at an increased risk for chlamydia or gonorrhea. Ask your health care provider if you are at risk.  Talk with your health care provider about whether you are at high risk of being infected with HIV. Your health care provider may recommend a prescription medicine to help prevent HIV infection.  What else can I do?  Schedule regular health, dental, and eye exams.  Stay current with your vaccines (immunizations).  Do not use any tobacco  products, such as cigarettes, chewing tobacco, and e-cigarettes. If you need help quitting, ask your health care provider.  Limit alcohol intake to no more than 2 drinks per day. One drink equals 12 ounces of beer, 5 ounces of wine, or 1 ounces of hard liquor.  Do not use street drugs.  Do not share needles.  Ask your health care provider for help if you need support or information about quitting drugs.  Tell your health care provider if you often feel depressed.  Tell your health care provider if you have ever been abused or do not feel safe at home. This information is not intended to replace advice given to you by your health care provider. Make sure you discuss any questions you have with your health care provider. Document Released: 11/05/2007 Document Revised: 01/06/2016 Document Reviewed: 02/10/2015 Elsevier Interactive Patient Education  [employer].

## 2017-07-03 NOTE — Progress Notes (Signed)
BP 136/78 (BP Location: Left Arm, Patient Position: Sitting, Cuff Size: Normal)   Pulse 87   Temp 98.5 F (36.9 C) (Oral)   Ht 5' 7.5" (1.715 m)   Wt 193 lb 8 oz (87.8 kg)   SpO2 97%   BMI 29.86 kg/m    CC: CPE Subjective:    Patient ID: Wayne Smith, male    DOB: 02-17-1971, 47 y.o.   MRN: [id number]  HPI: Wayne Smith is a 47 y.o. male presenting on 07/03/2017 for Annual Exam   Preventative: Prostate cancer - does have fmhx as father age 66yo with dx - will start at age 47 Flu shot yearly Td 2007  Seat belt use discussed Sunscreen use discussed. No changing moles on skin. Non smoker. GF smokes Alcohol - seldom on weekends  Caffeine: 2 cups coffee, sweet tea daily  Lives with GF, dog and cats  Occupation: Development worker, [employer]  Activity: no regular exercise, [place], walks dog, active at work  Diet: some water, fruits/vegetables daily, red meat once a week   Relevant past medical, surgical, family and social history reviewed and updated as indicated. Interim medical history since our last visit reviewed. Allergies and medications reviewed and updated. Outpatient Medications Prior to Visit  Medication Sig Dispense Refill  . Ascorbic Acid (VITAMIN C) 1000 MG tablet Take 1,000 mg by mouth daily.      . cetirizine (ZYRTEC) 10 MG tablet Take 10 mg by mouth daily.    . famciclovir (FAMVIR) 500 MG tablet TAKE ONE TABLET BY MOUTH THREE TIMES DAILY AS NEEDED AS DIRECTED 21 tablet 3  . Multiple Vitamin (MULTIVITAMIN) tablet Take 1 tablet by mouth daily.      Marland Kitchen triamcinolone lotion (KENALOG) 0.1 % Apply 1 application topically daily. 60 mL 1   No facility-administered medications prior to visit.      Per HPI unless specifically indicated in ROS section below Review of Systems  Constitutional: Negative for activity change, appetite change, chills, fatigue, fever and unexpected weight change.  HENT: Negative for hearing loss.   Eyes: Negative for visual disturbance.    Respiratory: Positive for cough (recent head cold now resolved). Negative for chest tightness, shortness of breath and wheezing.   Cardiovascular: Negative for chest pain, palpitations and leg swelling.  Gastrointestinal: Negative for abdominal distention, abdominal pain, blood in stool, constipation, diarrhea, nausea and vomiting.  Genitourinary: Negative for difficulty urinating and hematuria.  Musculoskeletal: Negative for arthralgias, myalgias and neck pain.  Skin: Negative for rash.  Neurological: Negative for dizziness, seizures, syncope and headaches.  Hematological: Negative for adenopathy. Does not bruise/bleed easily.  Psychiatric/Behavioral: Negative for dysphoric mood. The patient is not nervous/anxious.        Objective:    BP 136/78 (BP Location: Left Arm, Patient Position: Sitting, Cuff Size: Normal)   Pulse 87   Temp 98.5 F (36.9 C) (Oral)   Ht 5' 7.5" (1.715 m)   Wt 193 lb 8 oz (87.8 kg)   SpO2 97%   BMI 29.86 kg/m   Wt Readings from Last 3 Encounters:  07/03/17 193 lb 8 oz (87.8 kg)  05/17/16 192 lb 4 oz (87.2 kg)  03/10/15 188 lb (85.3 kg)    Physical Exam  Constitutional: He is oriented to person, place, and time. He appears well-developed and well-nourished. No distress.  HENT:  Head: Normocephalic and atraumatic.  Right Ear: Hearing, tympanic membrane, external ear and ear canal normal.  Left Ear: Hearing, tympanic membrane, external ear  and ear canal normal.  Nose: Nose normal.  Mouth/Throat: Uvula is midline, oropharynx is clear and moist and mucous membranes are normal. No oropharyngeal exudate, posterior oropharyngeal edema or posterior oropharyngeal erythema.  Eyes: Conjunctivae and EOM are normal. Pupils are equal, round, and reactive to light. No scleral icterus.  Neck: Normal range of motion. Neck supple. No thyromegaly present.  Cardiovascular: Normal rate, regular rhythm, normal heart sounds and intact distal pulses.  No murmur  heard. Pulses:      Radial pulses are 2+ on the right side, and 2+ on the left side.  Pulmonary/Chest: Effort normal and breath sounds normal. No respiratory distress. He has no wheezes. He has no rales.  Abdominal: Soft. Bowel sounds are normal. He exhibits no distension and no mass. There is no tenderness. There is no rebound and no guarding.  Musculoskeletal: Normal range of motion. He exhibits no edema.  Lymphadenopathy:    He has no cervical adenopathy.  Neurological: He is alert and oriented to person, place, and time.  CN grossly intact, station and gait intact  Skin: Skin is warm and dry. No rash noted.  Psychiatric: He has a normal mood and affect. His behavior is normal. Judgment and thought content normal.  Nursing note and vitals reviewed.  Results for orders placed or performed in visit on 06/27/17  PSA  Result Value Ref Range   PSA 1.47 0.10 - 4.00 ng/mL  Comprehensive metabolic panel  Result Value Ref Range   Sodium 141 135 - 145 mEq/L   Potassium 5.0 3.5 - 5.1 mEq/L   Chloride 105 96 - 112 mEq/L   CO2 30 19 - 32 mEq/L   Glucose, Bld 97 70 - 99 mg/dL   BUN 14 6 - 23 mg/dL   Creatinine, Ser 0.87 0.40 - 1.50 mg/dL   Total Bilirubin 0.6 0.2 - 1.2 mg/dL   Alkaline Phosphatase 57 39 - 117 U/L   AST 28 0 - 37 U/L   ALT 48 0 - 53 U/L   Total Protein 6.8 6.0 - 8.3 g/dL   Albumin 4.4 3.5 - 5.2 g/dL   Calcium 9.4 8.4 - 10.5 mg/dL   GFR 99.63 >60.00 mL/min  Lipid panel  Result Value Ref Range   Cholesterol 209 (H) 0 - 200 mg/dL   Triglycerides 140.0 0.0 - 149.0 mg/dL   HDL 47.30 >39.00 mg/dL   VLDL 28.0 0.0 - 40.0 mg/dL   LDL Cholesterol 134 (H) 0 - 99 mg/dL   Total CHOL/HDL Ratio 4    NonHDL 161.55       Assessment & Plan:   Problem List Items Addressed This Visit    Dyslipidemia    Chronic, stable off meds. Reviewed healthy diet choices to improve cholesterol levels.  The 10-year ASCVD risk score Mikey Bussing DC Brooke Bonito., et al., 2013) is: 3.2%   Values used to calculate  the score:     Age: 56 years     Sex: Male     Is Non-Hispanic African American: No     Diabetic: No     Tobacco smoker: No     Systolic Blood Pressure: 248 mmHg     Is BP treated: No     HDL Cholesterol: 47.3 mg/dL     Total Cholesterol: 209 mg/dL       Healthcare maintenance - Primary    Preventative protocols reviewed and updated unless pt declined. Discussed healthy diet and lifestyle.       Systolic murmur  Not appreciated today.        Other Visit Diagnoses    Need for influenza vaccination       Relevant Orders   Flu Vaccine QUAD 6+ mos PF IM (Fluarix Quad PF) (Completed)       Follow up plan: Return in about 1 year (around 07/03/2018) for annual exam, prior fasting for blood work.  Ria Bush, MD

## 2017-08-01 ENCOUNTER — Other Ambulatory Visit: Payer: Self-pay | Admitting: Family Medicine

## 2017-08-02 NOTE — Telephone Encounter (Signed)
Last filled:  10/30/16, #21 Last OV (CPE):  07/03/17 Next OV:  none

## 2017-08-15 ENCOUNTER — Other Ambulatory Visit: Payer: Self-pay | Admitting: Family Medicine

## 2017-08-16 NOTE — Telephone Encounter (Signed)
Last filled:  09/12/16 Last OV (CPE):  07/03/17 Next OV:  none

## 2017-08-25 ENCOUNTER — Telehealth: Payer: Self-pay

## 2017-08-25 NOTE — Telephone Encounter (Signed)
Received faxed Need for Clarification from [facility] stating they cannot get the triamcinolone lotion but do have the cream and ointment in stock.  Placed fax in Dr. Synthia Innocent box.

## 2017-08-26 MED ORDER — TRIAMCINOLONE ACETONIDE 0.1 % EX CREA: 1.0000 "application " | TOPICAL_CREAM | Freq: Two times a day (BID) | CUTANEOUS | 1 refills | Status: DC

## 2017-08-26 NOTE — Telephone Encounter (Signed)
Cream sent in

## 2018-02-13 DIAGNOSIS — H5213 Myopia, bilateral: Secondary | ICD-10-CM | POA: Diagnosis not present

## 2018-02-15 ENCOUNTER — Encounter: Payer: Self-pay | Admitting: Family Medicine

## 2018-02-15 DIAGNOSIS — D3131 Benign neoplasm of right choroid: Secondary | ICD-10-CM | POA: Insufficient documentation

## 2018-02-19 ENCOUNTER — Other Ambulatory Visit: Payer: Self-pay

## 2018-02-19 ENCOUNTER — Emergency Department (HOSPITAL_COMMUNITY)
Admission: EM | Admit: 2018-02-19 | Discharge: 2018-02-19 | Disposition: A | Discharge status: Home / Self Care | Payer: 59 | Attending: Emergency Medicine | Admitting: Emergency Medicine

## 2018-02-19 ENCOUNTER — Emergency Department (HOSPITAL_COMMUNITY): Payer: 59

## 2018-02-19 ENCOUNTER — Encounter (HOSPITAL_COMMUNITY): Payer: Self-pay | Admitting: Emergency Medicine

## 2018-02-19 DIAGNOSIS — S39012A Strain of muscle, fascia and tendon of lower back, initial encounter: Secondary | ICD-10-CM | POA: Diagnosis not present

## 2018-02-19 DIAGNOSIS — Y93E8 Activity, other personal hygiene: Secondary | ICD-10-CM | POA: Diagnosis not present

## 2018-02-19 DIAGNOSIS — Z79899 Other long term (current) drug therapy: Secondary | ICD-10-CM | POA: Diagnosis not present

## 2018-02-19 DIAGNOSIS — M545 Low back pain: Secondary | ICD-10-CM | POA: Diagnosis not present

## 2018-02-19 DIAGNOSIS — Y999 Unspecified external cause status: Secondary | ICD-10-CM | POA: Insufficient documentation

## 2018-02-19 DIAGNOSIS — Y92018 Other place in single-family (private) house as the place of occurrence of the external cause: Secondary | ICD-10-CM | POA: Diagnosis not present

## 2018-02-19 DIAGNOSIS — X500XXA Overexertion from strenuous movement or load, initial encounter: Secondary | ICD-10-CM | POA: Diagnosis not present

## 2018-02-19 DIAGNOSIS — M5489 Other dorsalgia: Secondary | ICD-10-CM | POA: Diagnosis present

## 2018-02-19 MED ORDER — METHOCARBAMOL 500 MG PO TABS
750.0000 mg | ORAL_TABLET | Freq: Once | ORAL | Status: AC
  Administered 2018-02-19: 750 mg via ORAL
  Filled 2018-02-19: qty 2

## 2018-02-19 MED ORDER — HYDROCODONE-ACETAMINOPHEN 5-325 MG PO TABS: 1.0000 | ORAL_TABLET | ORAL | 0 refills | Status: DC | PRN

## 2018-02-19 MED ORDER — METHOCARBAMOL 750 MG PO TABS: 750.0000 mg | ORAL_TABLET | Freq: Four times a day (QID) | ORAL | 0 refills | Status: DC

## 2018-02-19 MED ORDER — HYDROCODONE-ACETAMINOPHEN 5-325 MG PO TABS
1.0000 | ORAL_TABLET | Freq: Once | ORAL | Status: AC
  Administered 2018-02-19: 1 via ORAL
  Filled 2018-02-19: qty 1

## 2018-02-19 MED ORDER — IBUPROFEN 600 MG PO TABS: 600.0000 mg | ORAL_TABLET | Freq: Four times a day (QID) | ORAL | 0 refills | Status: DC | PRN

## 2018-02-19 NOTE — ED Triage Notes (Signed)
Pt reports having to go up and down a ladder working on a machine at work noticed lower back "caught" him this am. Pt denies pain going down legs or loss of bowel or bladder.

## 2018-02-19 NOTE — ED Notes (Signed)
Pt returned from xray

## 2018-02-19 NOTE — ED Notes (Signed)
Pt with lower back pain since 0530 this morning,  Getting ready for work and bent down, pain began.  Pt denies any radiation of pain.

## 2018-02-19 NOTE — ED Provider Notes (Signed)
[facility] EMERGENCY DEPARTMENT Provider Note   CSN: [id number] Arrival date & time: 02/19/18  1025     History   Chief Complaint Chief Complaint  Patient presents with  . Back Pain    HPI Wayne Smith is a 47 y.o. male with no concerning past medical history, reporting occasional low back twinges of pain which are fairly well controlled, was bending over this morning getting dressed when he had sudden onset of bilateral low back pain without radiation into either extremity.  He denies weakness or numbness in his legs and has had no urinary or fecal incontinence or retention since this event.  His pain is worsened with positional changes and walking.  He has taken ibuprofen 400 mg prior to arrival with no improvement in his pain.  He has no history of cancer, no IV drug use.  HPI  Past Medical History:  Diagnosis Date  . Allergic rhinitis, cause unspecified   . Herpes simplex without mention of complication   . Perirectal fistula 2012    Patient Active Problem List   Diagnosis Date Noted  . Choroidal nevus of right eye 02/15/2018  . Systolic murmur 23/76/2831  . Overweight 07/21/2014  . Dyslipidemia 12/10/2011  . Axillary abscess 05/02/2011  . Perianal fistula 11/12/2010  . Healthcare maintenance 09/10/2010  . HERPES LABIALIS 03/27/2009  . ALLERGIC RHINITIS, CHRONIC 03/27/2009    Past Surgical History:  Procedure Laterality Date  . ANAL FISTULECTOMY  01/12/11   x2  . Chest x-ray  10/29/92   Negative        Home Medications    Prior to Admission medications   Medication Sig Start Date End Date Taking? Authorizing Provider  Ascorbic Acid (VITAMIN C) 1000 MG tablet Take 1,000 mg by mouth daily.      [provider]  cetirizine (ZYRTEC) 10 MG tablet Take 10 mg by mouth daily.    [provider]  famciclovir (FAMVIR) 500 MG tablet TAKE ONE TABLET BY MOUTH THREE TIMES DAILY AS NEEDED AS DIRECTED 08/02/17   Ria Bush, MD    HYDROcodone-acetaminophen (NORCO/VICODIN) 5-325 MG tablet Take 1 tablet by mouth every 4 (four) hours as needed. 02/19/18   Evalee Jefferson, PA-C  ibuprofen (ADVIL,MOTRIN) 600 MG tablet Take 1 tablet (600 mg total) by mouth every 6 (six) hours as needed. 02/19/18   Evalee Jefferson, PA-C  methocarbamol (ROBAXIN-750) 750 MG tablet Take 1 tablet (750 mg total) by mouth 4 (four) times daily. 02/19/18   Evalee Jefferson, PA-C  Multiple Vitamin (MULTIVITAMIN) tablet Take 1 tablet by mouth daily.      [provider]  triamcinolone cream (KENALOG) 0.1 % Apply 1 application topically 2 (two) times daily. Apply to [place]. 08/26/17 08/26/18  Ria Bush, MD    Family History Family History  Problem Relation Age of Onset  . Stroke Mother 6  . Cancer Father 9       prostate  . Diabetes Maternal Grandmother   . Coronary artery disease Neg Hx   . Heart disease Neg Hx   . Crohn's disease Neg Hx     Social History Social History   Tobacco Use  . Smoking status: Never Smoker  . Smokeless tobacco: Never Used  Substance Use Topics  . Alcohol use: Yes    Comment: Occasional  . Drug use: No     Allergies   Patient has no known allergies.   Review of Systems Review of Systems  Constitutional: Negative for fever.  Respiratory: Negative  for shortness of breath.   Cardiovascular: Negative for chest pain and leg swelling.  Gastrointestinal: Negative for abdominal distention, abdominal pain and constipation.  Genitourinary: Negative for difficulty urinating, dysuria, flank pain, frequency and urgency.  Musculoskeletal: Positive for back pain. Negative for gait problem and joint swelling.  Skin: Negative for rash.  Neurological: Negative for weakness and numbness.     Physical Exam Updated Vital Signs BP (!) 128/92 (BP Location: Right Arm)   Pulse 75   Resp 16   Ht 5\' 8"  (1.727 m)   Wt 86.2 kg   SpO2 97%   BMI 28.89 kg/m   Physical Exam  Constitutional: He appears well-developed and  well-nourished.  HENT:  Head: Normocephalic.  Eyes: Conjunctivae are normal.  Neck: Normal range of motion. Neck supple.  Cardiovascular: Normal rate and intact distal pulses.  Pedal pulses normal.  Pulmonary/Chest: Effort normal.  Abdominal: Soft. Bowel sounds are normal. He exhibits no distension and no mass.  Musculoskeletal: Normal range of motion. He exhibits no edema.       Lumbar back: He exhibits tenderness. He exhibits no swelling, no edema and no spasm.  Bilateral low back pain with palpation.  There is no midline lumbar pain.  No palpable deformities.   Neurological: He is alert. He has normal strength. He displays no atrophy and no tremor. No sensory deficit. Gait normal.  Reflex Scores:      Patellar reflexes are 2+ on the right side and 2+ on the left side.      Achilles reflexes are 2+ on the right side and 2+ on the left side. No strength deficit noted in hip and knee flexor and extensor muscle groups.  Ankle flexion and extension intact.  Skin: Skin is warm and dry.  Psychiatric: He has a normal mood and affect.  Nursing note and vitals reviewed.    ED Treatments / Results  Labs (all labs ordered are listed, but only abnormal results are displayed) Labs Reviewed - No data to display  EKG None  Radiology Dg Lumbar Spine Complete  Result Date: 02/19/2018 CLINICAL DATA:  47 year old male with non radiating lower back pain since bending over this morning. Initial encounter. EXAM: LUMBAR SPINE - COMPLETE 4+ VIEW COMPARISON:  10/09/2008 abdominal films. 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 Narrowed L5-S1 disc space. Right L5 pars appears slightly narrowed. No clear pars defect. Minimal curvature lumbar spine.  No compression fracture. IMPRESSION: 1. 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 Mild L5-S1 disc space narrowing. Electronically Signed   By: Genia Del M.D.   On:  02/19/2018 12:48    Procedures Procedures (including critical care time)  Medications Ordered in ED Medications  HYDROcodone-acetaminophen (NORCO/VICODIN) 5-325 MG per tablet 1 tablet (1 tablet Oral Given 02/19/18 1224)  methocarbamol (ROBAXIN) tablet 750 mg (750 mg Oral Given 02/19/18 1224)     Initial Impression / Assessment and Plan / ED Course  I have reviewed the triage vital signs and the nursing notes.  Pertinent labs & imaging results that were available during my care of the patient were reviewed by me and considered in my medical decision making (see chart for details).     No neuro deficit on exam or by history to suggest emergent or surgical presentation.  discussed worsened sx that should prompt immediate re-evaluation including distal weakness, bowel/bladder retention/incontinence.     [place] controlled substance database reviewed.    Final Clinical Impressions(s) /  ED Diagnoses   Final diagnoses:  Strain of lumbar region, initial encounter    ED Discharge Orders         Ordered    methocarbamol (ROBAXIN-750) 750 MG tablet  4 times daily     02/19/18 1309    HYDROcodone-acetaminophen (NORCO/VICODIN) 5-325 MG tablet  Every 4 hours PRN     02/19/18 1309    ibuprofen (ADVIL,MOTRIN) 600 MG tablet  Every 6 hours PRN     02/19/18 1309           Evalee Jefferson, PA-C 02/19/18 1426    Sherwood Gambler, MD 02/19/18 1527

## 2018-02-19 NOTE — ED Notes (Signed)
No triage vital signs documented, vitals done and documented, EDP aware of VS

## 2018-02-19 NOTE — Discharge Instructions (Addendum)
T take your prescriptions as directed.  Do not drive within 4 hours of taking hydrocodone as this will make you drowsy.  Avoid lifting,  Bending,  Twisting or any other activity that worsens your pain over the next week.  Apply an  icepack  to your lower back for 10-15 minutes every 2 hours for the next 2 days, after which you may also apply heating pad for 20 minutes 3 times daily.  You should get rechecked if your symptoms are not better over the next 5 days or you develop increased pain,  Weakness in your leg(s) or loss of bladder or bowel function - these are symptoms of a worsening condition.

## 2018-02-19 NOTE — ED Notes (Signed)
Patient transported to X-ray 

## 2018-02-21 ENCOUNTER — Ambulatory Visit: Payer: 59 | Admitting: Family Medicine

## 2018-02-21 ENCOUNTER — Encounter: Payer: Self-pay | Admitting: Family Medicine

## 2018-02-21 ENCOUNTER — Telehealth: Payer: Self-pay

## 2018-02-21 VITALS — BP 120/80 | HR 99 | Temp 98.4°F | Ht 67.5 in | Wt 203.5 lb

## 2018-02-21 DIAGNOSIS — M545 Low back pain, unspecified: Secondary | ICD-10-CM

## 2018-02-21 DIAGNOSIS — Z23 Encounter for immunization: Secondary | ICD-10-CM

## 2018-02-21 DIAGNOSIS — Q7649 Other congenital malformations of spine, not associated with scoliosis: Secondary | ICD-10-CM | POA: Diagnosis not present

## 2018-02-21 NOTE — Progress Notes (Signed)
BP 120/80 (BP Location: Left Arm, Patient Position: Sitting, Cuff Size: Normal)   Pulse 99   Temp 98.4 F (36.9 C) (Oral)   Ht 5' 7.5" (1.715 m)   Wt 203 lb 8 oz (92.3 kg)   SpO2 96%   BMI 31.40 kg/m    CC: ER f/u visit Subjective:    Patient ID: Wayne Smith, male    DOB: 03/10/1971, 47 y.o.   MRN: [id number]  HPI: Wayne Smith is a 47 y.o. male presenting on 02/21/2018 for Hospitalization Follow-up (Here for ER visit f/u. )   Recent ER visit Monday for acute lower back pain. Records reviewed. Lumbar films with transitional L5 vertebra with partial articulation with upper sacrum on left, mild L5/S1 disc space narrowing. Dx lumbar strain, prescribed hydrocodone, ibuprofen and robaxin. Written out of work through tomorrow, light duty Thursday and Friday. Here for f/u.   Acute back pain started when bending over to dry legs after shower. Bilateral midline lower back pain. Pain improved when supine. No radiation of pain down legs, no numbness/weakness of legs, no bowel/bladder incontinence, no fevers, no saddle anesthesia. Denies inciting trauma/injury or falls.   Relevant past medical, surgical, family and social history reviewed and updated as indicated. Interim medical history since our last visit reviewed. Allergies and medications reviewed and updated. Outpatient Medications Prior to Visit  Medication Sig Dispense Refill  . Ascorbic Acid (VITAMIN C) 1000 MG tablet Take 1,000 mg by mouth daily.      . cetirizine (ZYRTEC) 10 MG tablet Take 10 mg by mouth daily.    . famciclovir (FAMVIR) 500 MG tablet TAKE ONE TABLET BY MOUTH THREE TIMES DAILY AS NEEDED AS DIRECTED 21 tablet 3  . HYDROcodone-acetaminophen (NORCO/VICODIN) 5-325 MG tablet Take 1 tablet by mouth every 4 (four) hours as needed. 15 tablet 0  . ibuprofen (ADVIL,MOTRIN) 600 MG tablet Take 1 tablet (600 mg total) by mouth every 6 (six) hours as needed. 30 tablet 0  . methocarbamol (ROBAXIN-750) 750 MG tablet Take 1 tablet (750  mg total) by mouth 4 (four) times daily. 20 tablet 0  . Multiple Vitamin (MULTIVITAMIN) tablet Take 1 tablet by mouth daily.      Marland Kitchen triamcinolone cream (KENALOG) 0.1 % Apply 1 application topically 2 (two) times daily. Apply to AA. 45 g 1   No facility-administered medications prior to visit.      Per HPI unless specifically indicated in ROS section below Review of Systems     Objective:    BP 120/80 (BP Location: Left Arm, Patient Position: Sitting, Cuff Size: Normal)   Pulse 99   Temp 98.4 F (36.9 C) (Oral)   Ht 5' 7.5" (1.715 m)   Wt 203 lb 8 oz (92.3 kg)   SpO2 96%   BMI 31.40 kg/m   Wt Readings from Last 3 Encounters:  02/21/18 203 lb 8 oz (92.3 kg)  02/19/18 190 lb (86.2 kg)  07/03/17 193 lb 8 oz (87.8 kg)    Physical Exam  Constitutional: He appears well-developed and well-nourished. No distress.  Musculoskeletal: Normal range of motion. He exhibits no edema.  No significant pain midline spine No paraspinous mm tenderness Neg SLR bilaterally. No pain with int/ext rotation at hip. Neg FABER. No pain at SIJ, GTB or sciatic notch bilaterally.   Neurological: He is alert.  5/5 strength BLE 2+ patellar DTR Able to heel and toe walk  Nursing note and vitals reviewed.     Assessment & Plan:  Problem List Items Addressed This Visit    Transitional vertebra   Low back pain - Primary    Lumbar strain, possible bulging disc by xray. No red flags on exam.  Supportive care reviewed. rec scheduled ibuprofen, robaxin at night, hydrocodone for breakthrough pain. Lower back exercises provided today.  Out of work note for the rest of the week provided today.  Doubt transitional L5 vertebra contributing.        Other Visit Diagnoses    Need for influenza vaccination       Relevant Orders   Flu Vaccine QUAD 36+ mos IM (Completed)       No orders of the defined types were placed in this encounter.  Orders Placed This Encounter  Procedures  . Flu Vaccine QUAD 36+  mos IM    Follow up plan: Return if symptoms worsen or fail to improve.  Ria Bush, MD

## 2018-02-21 NOTE — Telephone Encounter (Signed)
Left message on vm for pt to call back.  Need to inform him a work note will be at the front desk for him.  [Placed note at front office.]

## 2018-02-21 NOTE — Assessment & Plan Note (Signed)
Lumbar strain, possible bulging disc by xray. No red flags on exam.  Supportive care reviewed. rec scheduled ibuprofen, robaxin at night, hydrocodone for breakthrough pain. Lower back exercises provided today.  Out of work note for the rest of the week provided today.  Doubt transitional L5 vertebra contributing.

## 2018-02-21 NOTE — Patient Instructions (Signed)
Flu shot today. Lower back pain likely lumbar strain, may be herniated disc - both should improve over time. Treat with heating pad to lower back, gentle stretching exercises - handout provided today. Take ibuprofen 1-2 times daily with meals, robaxin muscle relaxant for sleep, and hydrocodone pain medicine for breakthrough pain.  Let me know sooner if any weakness of the legs or progressing symptoms.  Out of work rest of this week. Limit heavy lifting, bending, stooping for next few weeks if possible.

## 2018-07-31 ENCOUNTER — Telehealth: Payer: Self-pay | Admitting: Family Medicine

## 2018-07-31 NOTE — Telephone Encounter (Signed)
Attempted to return pt's call. Left message on vm for him to call back.

## 2018-07-31 NOTE — Telephone Encounter (Signed)
Best number [phone] Pt called scheduled his cpx and cpx labs in labs  And wanted Lattie Haw to call him back about cpx labs he is having done.  He had a question about a certain lab  (personal) pt would not go into what labs he wanted he only wanted to speak to [facility]

## 2018-08-01 NOTE — Telephone Encounter (Signed)
Returned pt's call. He states his wife has been dx with mono and is asking if he should be tested also. Pt denies any sxs. [Gives permission to lvm with Dr. Synthia Innocent response.]

## 2018-08-01 NOTE — Telephone Encounter (Signed)
Don't recommend testing if no symptoms (ST, fever, fatigue).

## 2018-08-01 NOTE — Telephone Encounter (Signed)
Left message on vm, per pt request, relaying Dr. Synthia Innocent message.

## 2018-09-21 ENCOUNTER — Other Ambulatory Visit: Payer: 59

## 2018-09-27 ENCOUNTER — Encounter: Payer: 59 | Admitting: Family Medicine

## 2018-12-08 ENCOUNTER — Other Ambulatory Visit: Payer: Self-pay | Admitting: Family Medicine

## 2018-12-10 NOTE — Telephone Encounter (Signed)
Eprescribed.

## 2018-12-10 NOTE — Telephone Encounter (Signed)
Pt called checking on refill he is out of meds  Best number [phone]  [facility] in [place]

## 2018-12-10 NOTE — Telephone Encounter (Signed)
Last filled:  08/02/17, #21 x 3 refills Last OV (CPE):  07/03/17 CPE and 02/21/18 Acute Next OV:  01/18/19 (labs 01/11/19)

## 2019-01-09 ENCOUNTER — Telehealth: Payer: Self-pay

## 2019-01-09 ENCOUNTER — Other Ambulatory Visit: Payer: Self-pay | Admitting: Family Medicine

## 2019-01-09 DIAGNOSIS — Z125 Encounter for screening for malignant neoplasm of prostate: Secondary | ICD-10-CM

## 2019-01-09 DIAGNOSIS — E785 Hyperlipidemia, unspecified: Secondary | ICD-10-CM

## 2019-01-09 NOTE — Telephone Encounter (Signed)
Left message to call clinic, needs COVID screen and back door lab info   

## 2019-01-11 ENCOUNTER — Other Ambulatory Visit: Payer: Self-pay

## 2019-01-11 ENCOUNTER — Other Ambulatory Visit (INDEPENDENT_AMBULATORY_CARE_PROVIDER_SITE_OTHER): Payer: BC Managed Care – PPO

## 2019-01-11 DIAGNOSIS — Z125 Encounter for screening for malignant neoplasm of prostate: Secondary | ICD-10-CM | POA: Diagnosis not present

## 2019-01-11 DIAGNOSIS — E785 Hyperlipidemia, unspecified: Secondary | ICD-10-CM

## 2019-01-11 LAB — LIPID PANEL
Cholesterol: 181 mg/dL (ref 0–200)
HDL: 34.7 mg/dL — ABNORMAL LOW (ref >39.00)
LDL Cholesterol: 126 mg/dL — ABNORMAL HIGH (ref 0–99)
NonHDL: 146.34
Total CHOL/HDL Ratio: 5
Triglycerides: 100 mg/dL (ref 0.0–149.0)
VLDL: 20 mg/dL (ref 0.0–40.0)

## 2019-01-11 LAB — COMPREHENSIVE METABOLIC PANEL
ALT: 54 U/L — ABNORMAL HIGH (ref 0–53)
AST: 25 U/L (ref 0–37)
Albumin: 4.4 g/dL (ref 3.5–5.2)
Alkaline Phosphatase: 57 U/L (ref 39–117)
BUN: 13 mg/dL (ref 6–23)
CO2: 29 mEq/L (ref 19–32)
Calcium: 9.1 mg/dL (ref 8.4–10.5)
Chloride: 106 mEq/L (ref 96–112)
Creatinine, Ser: 0.89 mg/dL (ref 0.40–1.50)
GFR: 90.73 mL/min (ref >60.00)
Glucose, Bld: 100 mg/dL — ABNORMAL HIGH (ref 70–99)
Potassium: 4.8 mEq/L (ref 3.5–5.1)
Sodium: 140 mEq/L (ref 135–145)
Total Bilirubin: 0.5 mg/dL (ref 0.2–1.2)
Total Protein: 6.3 g/dL (ref 6.0–8.3)

## 2019-01-11 LAB — PSA: PSA: 1.11 ng/mL (ref 0.10–4.00)

## 2019-01-18 ENCOUNTER — Encounter: Payer: 59 | Admitting: Family Medicine

## 2019-01-22 ENCOUNTER — Other Ambulatory Visit: Payer: Self-pay

## 2019-01-22 ENCOUNTER — Ambulatory Visit (INDEPENDENT_AMBULATORY_CARE_PROVIDER_SITE_OTHER): Payer: BC Managed Care – PPO | Admitting: Family Medicine

## 2019-01-22 ENCOUNTER — Encounter: Payer: Self-pay | Admitting: Family Medicine

## 2019-01-22 ENCOUNTER — Ambulatory Visit (INDEPENDENT_AMBULATORY_CARE_PROVIDER_SITE_OTHER)
Admission: RE | Admit: 2019-01-22 | Discharge: 2019-01-22 | Disposition: A | Discharge status: Home / Self Care | Payer: BC Managed Care – PPO | Source: Ambulatory Visit | Attending: Family Medicine | Admitting: Family Medicine

## 2019-01-22 VITALS — BP 122/80 | HR 90 | Temp 98.0°F | Ht 67.5 in | Wt 205.1 lb

## 2019-01-22 DIAGNOSIS — Z0000 Encounter for general adult medical examination without abnormal findings: Secondary | ICD-10-CM | POA: Diagnosis not present

## 2019-01-22 DIAGNOSIS — E669 Obesity, unspecified: Secondary | ICD-10-CM

## 2019-01-22 DIAGNOSIS — Z23 Encounter for immunization: Secondary | ICD-10-CM | POA: Diagnosis not present

## 2019-01-22 DIAGNOSIS — E785 Hyperlipidemia, unspecified: Secondary | ICD-10-CM

## 2019-01-22 DIAGNOSIS — R0602 Shortness of breath: Secondary | ICD-10-CM

## 2019-01-22 DIAGNOSIS — R0600 Dyspnea, unspecified: Secondary | ICD-10-CM | POA: Diagnosis not present

## 2019-01-22 DIAGNOSIS — M25511 Pain in right shoulder: Secondary | ICD-10-CM

## 2019-01-22 DIAGNOSIS — G8929 Other chronic pain: Secondary | ICD-10-CM

## 2019-01-22 NOTE — Progress Notes (Signed)
This visit was conducted in person.  BP 122/80 (BP Location: Left Arm, Patient Position: Sitting, Cuff Size: Large)   Pulse 90   Temp 98 F (36.7 C) (Temporal)   Ht 5' 7.5" (1.715 m)   Wt 205 lb 2 oz (93 kg)   SpO2 98%   BMI 31.65 kg/m    CC: CPE Subjective:    Patient ID: Wayne Smith, male    DOB: 05-06-1971, 48 y.o.   MRN: [id number]  HPI: Wayne Smith is a 48 y.o. male presenting on 01/22/2019 for Annual Exam   Got married in the past year. Wife is from [place].   Ongoing R shoulder pain over last 7 months, some better now. Denies inciting trauma. Woke up with pain, some pain with raising shoulder above head. Enjoys kayaking - felt this helped. No current medicine for this. No neck pain or shooting pain down arm.   Noticing some dyspnea over the last 3-4 months. Gets easily short winded over tying shoes. Some dyspnea when walking up hills. No chest pain/tightness, cough of wheezing, no h/o asthma. Occasional tachy-palpitations, separate from dyspnea. Can be noticed when sitting at rest. Caffeine - 2 cups coffee in am, 1/3 gallon sweet tea throughout the day.   Preventative: Prostate cancer - does have fmhx as father age 75yo with dx- will start at age 85 Flu 105 Td 2007, Tdap today  Seat belt use discussed Sunscreen use discussed. No changing moles on skin. Non smoker. Wife smokes outside.  Alcohol - occasional on weekends Dentist - q6 mo - recent dental work this year Eye exam yearly   Caffeine: 2 cups coffee, sweet tea daily  Lives with wife, dog and cats  Occupation: Development worker, [employer]  Activity:no regular exercise,some walking, active at work Diet: some water, fruits/vegetables daily, red meat once a week     Relevant past medical, surgical, family and social history reviewed and updated as indicated. Interim medical history since our last visit reviewed. Allergies and medications reviewed and updated. Outpatient Medications Prior to Visit   Medication Sig Dispense Refill  . amoxicillin (AMOXIL) 250 MG capsule Take 1 capsule by mouth every 8 (eight) hours.    . Ascorbic Acid (VITAMIN C) 1000 MG tablet Take 1,000 mg by mouth daily.      . cetirizine (ZYRTEC) 10 MG tablet Take 10 mg by mouth daily.    . famciclovir (FAMVIR) 500 MG tablet TAKE 1 TABLET BY MOUTH THREE TIMES DAILY AS NEEDED AS DIRECTED 21 tablet 3  . Multiple Vitamin (MULTIVITAMIN) tablet Take 1 tablet by mouth daily.      Marland Kitchen HYDROcodone-acetaminophen (NORCO/VICODIN) 5-325 MG tablet Take 1 tablet by mouth every 4 (four) hours as needed. 15 tablet 0  . ibuprofen (ADVIL,MOTRIN) 600 MG tablet Take 1 tablet (600 mg total) by mouth every 6 (six) hours as needed. 30 tablet 0  . methocarbamol (ROBAXIN-750) 750 MG tablet Take 1 tablet (750 mg total) by mouth 4 (four) times daily. 20 tablet 0   No facility-administered medications prior to visit.      Per HPI unless specifically indicated in ROS section below Review of Systems  Constitutional: Negative for activity change, appetite change, chills, fatigue, fever and unexpected weight change.  HENT: Negative for hearing loss.   Eyes: Negative for visual disturbance.  Respiratory: Positive for shortness of breath. Negative for cough, chest tightness and wheezing.   Cardiovascular: Positive for palpitations (occasional). Negative for chest pain and leg swelling.  Gastrointestinal: Negative  for abdominal distention, abdominal pain, blood in stool, constipation, diarrhea, nausea and vomiting.  Genitourinary: Negative for difficulty urinating and hematuria.  Musculoskeletal: Negative for arthralgias, myalgias and neck pain.  Skin: Negative for rash.  Neurological: Negative for dizziness, seizures, syncope and headaches.  Hematological: Negative for adenopathy. Does not bruise/bleed easily.  Psychiatric/Behavioral: Negative for dysphoric mood. The patient is not nervous/anxious.    Objective:    BP 122/80 (BP Location: Left  Arm, Patient Position: Sitting, Cuff Size: Large)   Pulse 90   Temp 98 F (36.7 C) (Temporal)   Ht 5' 7.5" (1.715 m)   Wt 205 lb 2 oz (93 kg)   SpO2 98%   BMI 31.65 kg/m   Wt Readings from Last 3 Encounters:  01/22/19 205 lb 2 oz (93 kg)  02/21/18 203 lb 8 oz (92.3 kg)  02/19/18 190 lb (86.2 kg)    Physical Exam Vitals signs and nursing note reviewed.  Constitutional:      General: He is not in acute distress.    Appearance: Normal appearance. He is well-developed. He is not ill-appearing.  HENT:     Head: Normocephalic and atraumatic.     Right Ear: Hearing, tympanic membrane, ear canal and external ear normal.     Left Ear: Hearing, tympanic membrane, ear canal and external ear normal.     Nose: Nose normal.     Mouth/Throat:     Mouth: Mucous membranes are moist.     Pharynx: Uvula midline. No oropharyngeal exudate or posterior oropharyngeal erythema.  Eyes:     General: No scleral icterus.    Extraocular Movements: Extraocular movements intact.     Conjunctiva/sclera: Conjunctivae normal.     Pupils: Pupils are equal, round, and reactive to light.  Neck:     Musculoskeletal: Normal range of motion and neck supple.     Vascular: No carotid bruit.  Cardiovascular:     Rate and Rhythm: Normal rate and regular rhythm.     Pulses: Normal pulses.          Radial pulses are 2+ on the right side and 2+ on the left side.     Heart sounds: Normal heart sounds. No murmur.  Pulmonary:     Effort: Pulmonary effort is normal. No respiratory distress.     Breath sounds: Normal breath sounds. No wheezing, rhonchi or rales.  Abdominal:     General: Abdomen is flat. Bowel sounds are normal. There is no distension.     Palpations: Abdomen is soft. There is no mass.     Tenderness: There is no abdominal tenderness. There is no guarding or rebound.     Hernia: No hernia is present.  Musculoskeletal: Normal range of motion.     Comments:  L shoulder WNL R shoulder exam: No deformity  of shoulders on inspection. No pain with palpation of shoulder landmarks. FROM in abduction and forward flexion. No pain or weakness with testing SITS in ext/int rotation. No pain with empty can sign. Pain with Speed test. + impingement. Discomfort with crossover test. No pain with rotation of humeral head in [facility] joint.   Lymphadenopathy:     Cervical: No cervical adenopathy.  Skin:    General: Skin is warm and dry.     Findings: No rash.  Neurological:     General: No focal deficit present.     Mental Status: He is alert and oriented to person, place, and time.     Comments: CN grossly intact,  station and gait intact  Psychiatric:        Mood and Affect: Mood normal.        Behavior: Behavior normal.        Thought Content: Thought content normal.        Judgment: Judgment normal.       Results for orders placed or performed in visit on 01/11/19  PSA  Result Value Ref Range   PSA 1.11 0.10 - 4.00 ng/mL  Comprehensive metabolic panel  Result Value Ref Range   Sodium 140 135 - 145 mEq/L   Potassium 4.8 3.5 - 5.1 mEq/L   Chloride 106 96 - 112 mEq/L   CO2 29 19 - 32 mEq/L   Glucose, Bld 100 (H) 70 - 99 mg/dL   BUN 13 6 - 23 mg/dL   Creatinine, Ser 0.89 0.40 - 1.50 mg/dL   Total Bilirubin 0.5 0.2 - 1.2 mg/dL   Alkaline Phosphatase 57 39 - 117 U/L   AST 25 0 - 37 U/L   ALT 54 (H) 0 - 53 U/L   Total Protein 6.3 6.0 - 8.3 g/dL   Albumin 4.4 3.5 - 5.2 g/dL   Calcium 9.1 8.4 - 10.5 mg/dL   GFR 90.73 >60.00 mL/min  Lipid panel  Result Value Ref Range   Cholesterol 181 0 - 200 mg/dL   Triglycerides 100.0 0.0 - 149.0 mg/dL   HDL 34.70 (L) >39.00 mg/dL   VLDL 20.0 0.0 - 40.0 mg/dL   LDL Cholesterol 126 (H) 0 - 99 mg/dL   Total CHOL/HDL Ratio 5    NonHDL 146.34    EKG - NSR rate 75, normal axis, intervals, no acute ST/T changes.  Assessment & Plan:   Problem List Items Addressed This Visit    Shortness of breath    Progressive dyspnea noted over the past several months  in setting of 15 lb weight gain. Check CXR, EKG today. Possible deconditioning, encouraged re incorporating regular exercise into routine. Also encouraged decreased caffeine intake. If ongoing, consider BNP, TSH, CBC and further evaluation. Pt agrees with plan.       Relevant Orders   DG Chest 2 View   EKG 12-Lead (Completed)   Obesity, Class I, BMI 30.0-34.9 (see actual BMI)    Weight gain over the past year reviewed (15 lbs). Encouraged healthy diet and lifestyle changes to affect sustainable weight loss.       Healthcare maintenance - Primary    Preventative protocols reviewed and updated unless pt declined. Discussed healthy diet and lifestyle.       Dyslipidemia    Chronic, stable off statins.  The 10-year ASCVD risk score Mikey Bussing DC Brooke Bonito., et al., 2013) is: 3.7%   Values used to calculate the score:     Age: 8 years     Sex: Male     Is Non-Hispanic African American: No     Diabetic: No     Tobacco smoker: No     Systolic Blood Pressure: 123XX123 mmHg     Is BP treated: No     HDL Cholesterol: 34.7 mg/dL     Total Cholesterol: 181 mg/dL       Chronic right shoulder pain    Ongoing for 7 months, exam suspicious for impingement or biceps tendonitis - will provide with biceps stretching exercises and resistance band, update if ongoing trouble.        Other Visit Diagnoses    Need for influenza vaccination  Relevant Orders   Flu Vaccine QUAD 36+ mos IM (Completed)   Need for Tdap vaccination       Relevant Orders   Tdap vaccine greater than or equal to 7yo IM (Completed)       No orders of the defined types were placed in this encounter.  Orders Placed This Encounter  Procedures  . DG Chest 2 View    Standing Status:   Future    Number of Occurrences:   1    Standing Expiration Date:   03/23/2020    Order Specific Question:   Reason for Exam (SYMPTOM  OR DIAGNOSIS REQUIRED)    Answer:   dyspnea    Order Specific Question:   Preferred imaging location?    Answer:    [facility]    Order Specific Question:   Radiology Contrast Protocol - do NOT remove file path    Answer:   [email or web address]  . Flu Vaccine QUAD 36+ mos IM  . Tdap vaccine greater than or equal to 7yo IM  . EKG 12-Lead    Follow up plan: Return in about 1 year (around 01/22/2020) for annual exam, prior fasting for blood work.  Ria Bush, MD

## 2019-01-22 NOTE — Assessment & Plan Note (Signed)
Chronic, stable off statins.  The 10-year ASCVD risk score Mikey Bussing DC Brooke Bonito., et al., 2013) is: 3.7%   Values used to calculate the score:     Age: 48 years     Sex: Male     Is Non-Hispanic African American: No     Diabetic: No     Tobacco smoker: No     Systolic Blood Pressure: 123XX123 mmHg     Is BP treated: No     HDL Cholesterol: 34.7 mg/dL     Total Cholesterol: 181 mg/dL

## 2019-01-22 NOTE — Patient Instructions (Addendum)
Flu and Tdap (tetanus and whooping cough) shots today. EKG today, chest xray today for further evaluation of shortness of breath.  For right shoulder - try exercises provided today. Let us know if not improving with this.   Health Maintenance, Male Adopting a healthy lifestyle and getting preventive care are important in promoting health and wellness. Ask your health care provider about:  The right schedule for you to have regular tests and exams.  Things you can do on your own to prevent diseases and keep yourself healthy. What should I know about diet, weight, and exercise? Eat a healthy diet   Eat a diet that includes plenty of vegetables, fruits, low-fat dairy products, and lean protein.  Do not eat a lot of foods that are high in solid fats, added sugars, or sodium. Maintain a healthy weight Body mass index (BMI) is a measurement that can be used to identify possible weight problems. It estimates body fat based on height and weight. Your health care provider can help determine your BMI and help you achieve or maintain a healthy weight. Get regular exercise Get regular exercise. This is one of the most important things you can do for your health. Most adults should:  Exercise for at least 150 minutes each week. The exercise should increase your heart rate and make you sweat (moderate-intensity exercise).  Do strengthening exercises at least twice a week. This is in addition to the moderate-intensity exercise.  Spend less time sitting. Even light physical activity can be beneficial. Watch cholesterol and blood lipids Have your blood tested for lipids and cholesterol at 49 years of age, then have this test every 5 years. You may need to have your cholesterol levels checked more often if:  Your lipid or cholesterol levels are high.  You are older than 49 years of age.  You are at high risk for heart disease. What should I know about cancer screening? Many types of cancers can be  detected early and may often be prevented. Depending on your health history and family history, you may need to have cancer screening at various ages. This may include screening for:  Colorectal cancer.  Prostate cancer.  Skin cancer.  Lung cancer. What should I know about heart disease, diabetes, and high blood pressure? Blood pressure and heart disease  High blood pressure causes heart disease and increases the risk of stroke. This is more likely to develop in people who have high blood pressure readings, are of African descent, or are overweight.  Talk with your health care provider about your target blood pressure readings.  Have your blood pressure checked: ? Every 3-5 years if you are 13-41 years of age. ? Every year if you are 1 years old or older.  If you are between the ages of 24 and 31 and are a current or former smoker, ask your health care provider if you should have a one-time screening for abdominal aortic aneurysm (AAA). Diabetes Have regular diabetes screenings. This checks your fasting blood sugar level. Have the screening done:  Once every three years after age 27 if you are at a normal weight and have a low risk for diabetes.  More often and at a younger age if you are overweight or have a high risk for diabetes. What should I know about preventing infection? Hepatitis B If you have a higher risk for hepatitis B, you should be screened for this virus. Talk with your health care provider to find out if you are  at risk for hepatitis B infection. Hepatitis C Blood testing is recommended for:  Everyone born from [birth year] through 1966.  Anyone with known risk factors for hepatitis C. Sexually transmitted infections (STIs)  You should be screened each year for STIs, including gonorrhea and chlamydia, if: ? You are sexually active and are younger than 49 years of age. ? You are older than 49 years of age and your health care provider tells you that you are at risk  for this type of infection. ? Your sexual activity has changed since you were last screened, and you are at increased risk for chlamydia or gonorrhea. Ask your health care provider if you are at risk.  Ask your health care provider about whether you are at high risk for HIV. Your health care provider may recommend a prescription medicine to help prevent HIV infection. If you choose to take medicine to prevent HIV, you should first get tested for HIV. You should then be tested every 3 months for as long as you are taking the medicine. Follow these instructions at home: Lifestyle  Do not use any products that contain nicotine or tobacco, such as cigarettes, e-cigarettes, and chewing tobacco. If you need help quitting, ask your health care provider.  Do not use street drugs.  Do not share needles.  Ask your health care provider for help if you need support or information about quitting drugs. Alcohol use  Do not drink alcohol if your health care provider tells you not to drink.  If you drink alcohol: ? Limit how much you have to 0-2 drinks a day. ? Be aware of how much alcohol is in your drink. In the [place] one drink equals one 12 oz bottle of beer (355 mL), one 5 oz glass of wine (148 mL), or one 1 oz glass of hard liquor (44 mL). General instructions  Schedule regular health, dental, and eye exams.  Stay current with your vaccines.  Tell your health care provider if: ? You often feel depressed. ? You have ever been abused or do not feel safe at home. Summary  Adopting a healthy lifestyle and getting preventive care are important in promoting health and wellness.  Follow your health care provider's instructions about healthy diet, exercising, and getting tested or screened for diseases.  Follow your health care provider's instructions on monitoring your cholesterol and blood pressure. This information is not intended to replace advice given to you by your health care provider.  Make sure you discuss any questions you have with your health care provider. Document Released: 11/05/2007 Document Revised: 05/02/2018 Document Reviewed: 05/02/2018 Elsevier Patient Education  2020 [employer].

## 2019-01-22 NOTE — Assessment & Plan Note (Signed)
Ongoing for 7 months, exam suspicious for impingement or biceps tendonitis - will provide with biceps stretching exercises and resistance band, update if ongoing trouble.

## 2019-01-22 NOTE — Assessment & Plan Note (Signed)
Preventative protocols reviewed and updated unless pt declined. Discussed healthy diet and lifestyle.  

## 2019-01-22 NOTE — Assessment & Plan Note (Signed)
Weight gain over the past year reviewed (15 lbs). Encouraged healthy diet and lifestyle changes to affect sustainable weight loss.

## 2019-01-22 NOTE — Assessment & Plan Note (Addendum)
Progressive dyspnea noted over the past several months in setting of 15 lb weight gain. Check CXR, EKG today. Possible deconditioning, encouraged re incorporating regular exercise into routine. Also encouraged decreased caffeine intake. If ongoing, consider BNP, TSH, CBC and further evaluation. Pt agrees with plan.

## 2019-02-27 IMAGING — DX DG LUMBAR SPINE COMPLETE 4+V
5 series · 5 of 5 positions shown · non-contrast
Comparison: 10/09/2008 abdominal films.

CLINICAL DATA: 47-year-old male with non radiating lower back pain
since bending over this morning. Initial encounter.

EXAM:
LUMBAR SPINE - COMPLETE 4+ VIEW

[l-spine ap]
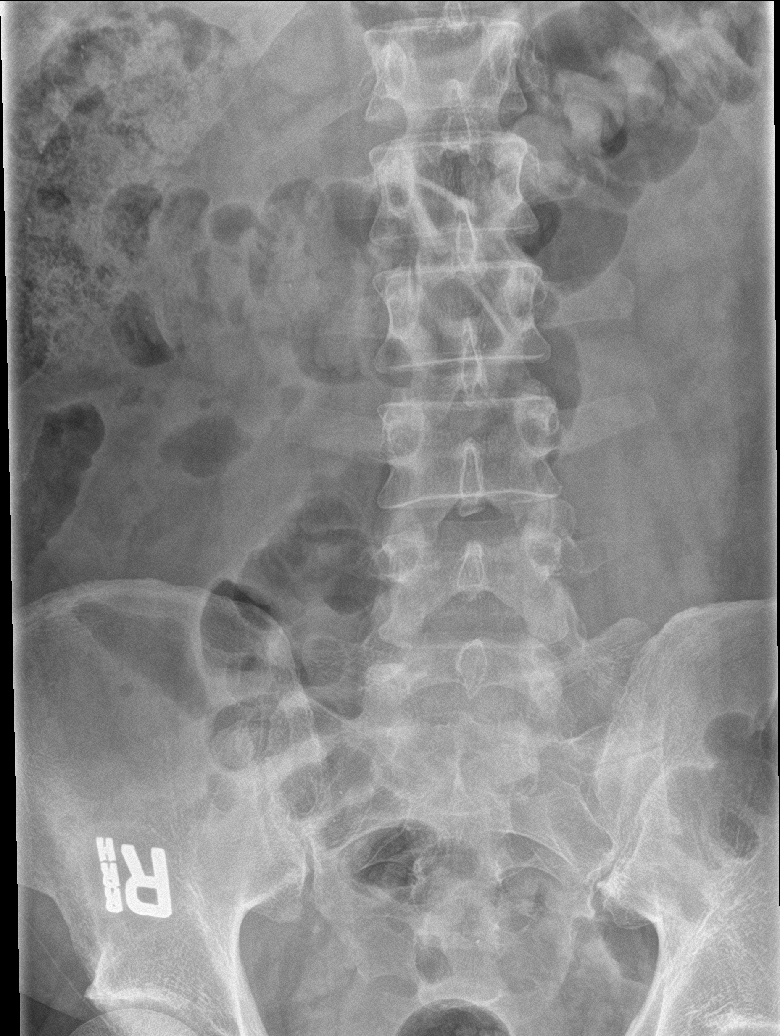

[l-spine obl (1 of 2)]
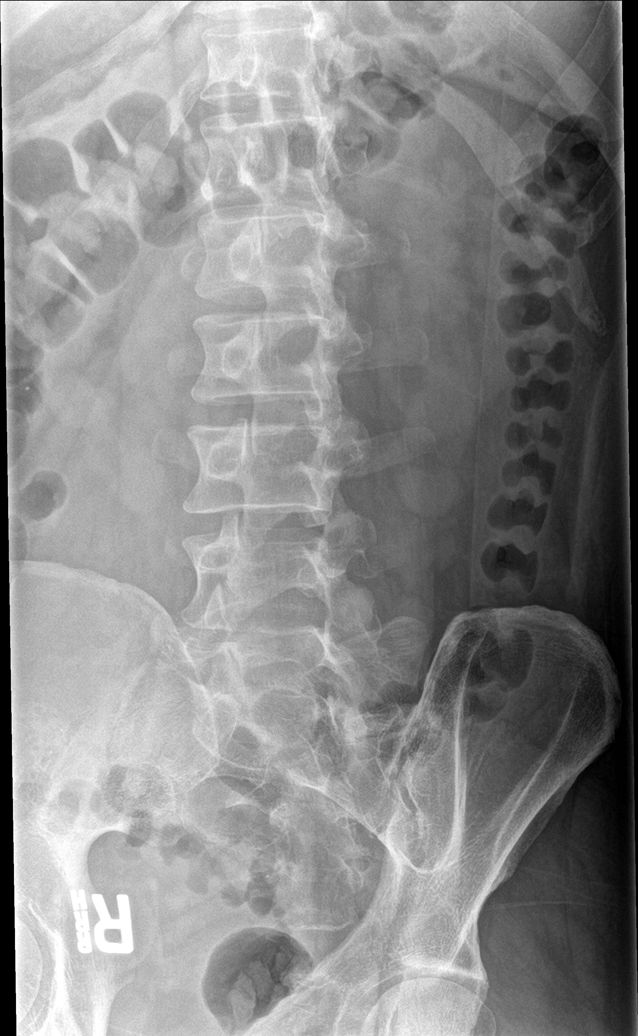

[l-spine obl (2 of 2)]
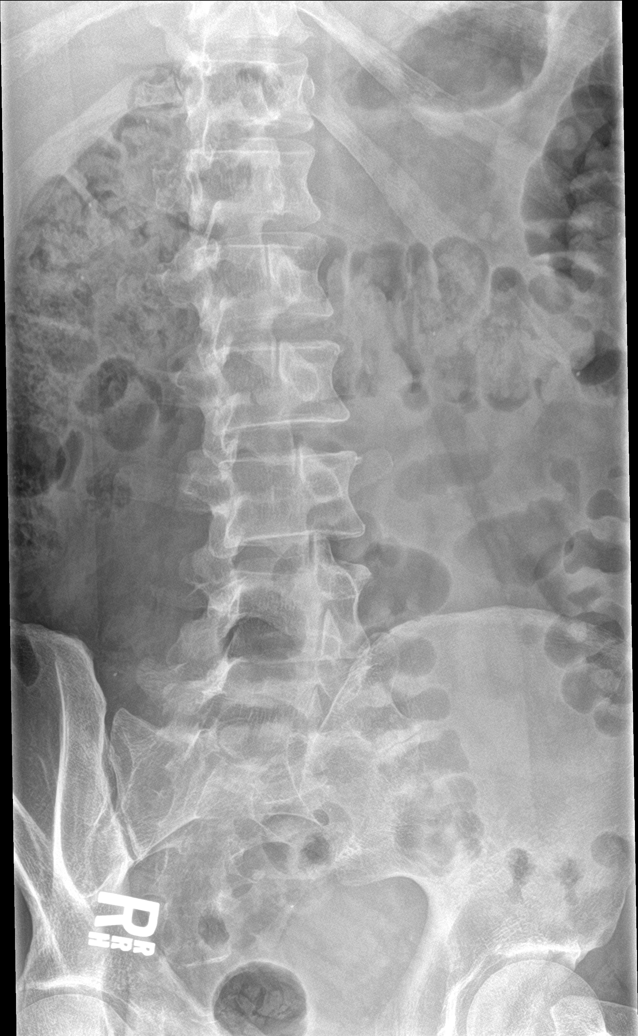

[l-spine lat]
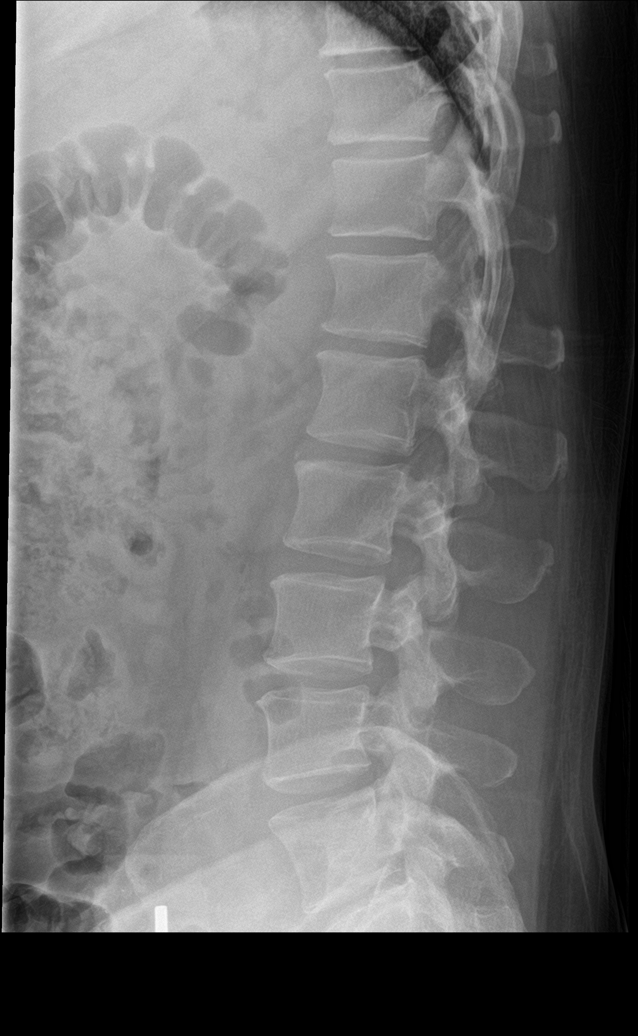

[l-spine spot]
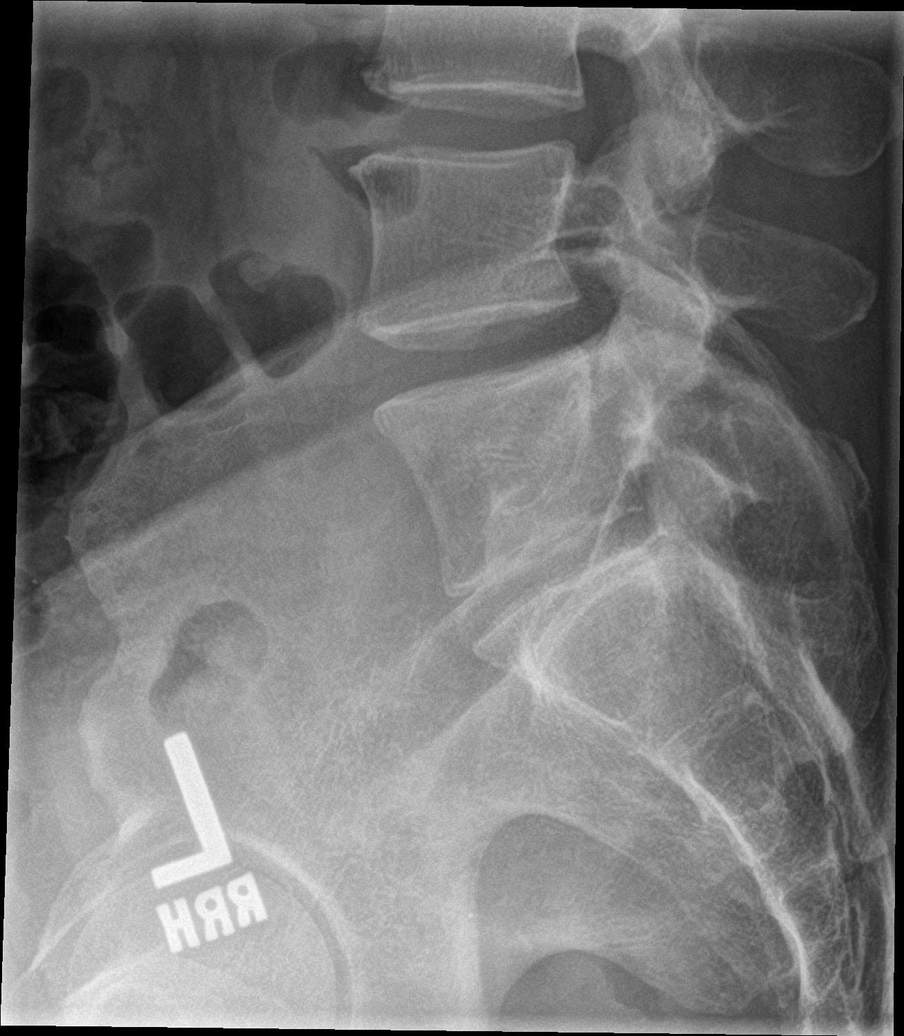

[5 of 5 positions shown; findings below may reference images not displayed]

FINDINGS: Transitional L5 vertebra with partial articulation with upper sacrum
on left. Narrowed L5-S1 disc space. Right L5 pars appears slightly
narrowed. No clear pars defect.

Minimal curvature lumbar spine.  No compression fracture.
IMPRESSION: 1. Transitional L5 vertebra with partial articulation with upper
sacrum on the left. Mild L5-S1 disc space narrowing.

## 2019-04-15 ENCOUNTER — Other Ambulatory Visit: Payer: Self-pay | Admitting: Family Medicine

## 2019-07-19 ENCOUNTER — Telehealth: Payer: Self-pay

## 2019-07-19 NOTE — Telephone Encounter (Signed)
Spoke with pt scheduling NV on 07/23/19 at 12:00.  Pt advised to park in back of bldg and call upon arrival.  FYI to Dr. Darnell Level.

## 2019-07-19 NOTE — Telephone Encounter (Signed)
Pt left a VM on triage line asking Lattie Haw call him back. He left no information.  I called him back. He said his wife has had strep throat 3 times in 6 months. Her PCP suggested that he be tested to see if he is a carrier. Asking if that can be done as a lab visit. Please advise.

## 2019-07-19 NOTE — Telephone Encounter (Signed)
Reasonable.  Would do as a nurse visit. Can we swab him in the parking lot?  I can do it if needed.

## 2019-07-23 ENCOUNTER — Other Ambulatory Visit: Payer: Self-pay

## 2019-07-23 ENCOUNTER — Ambulatory Visit (INDEPENDENT_AMBULATORY_CARE_PROVIDER_SITE_OTHER): Payer: BC Managed Care – PPO

## 2019-07-23 DIAGNOSIS — J029 Acute pharyngitis, unspecified: Secondary | ICD-10-CM

## 2019-07-23 LAB — POCT RAPID STREP A (OFFICE): Rapid Strep A Screen: NEGATIVE

## 2020-01-24 ENCOUNTER — Ambulatory Visit
Admission: EM | Admit: 2020-01-24 | Discharge: 2020-01-24 | Disposition: A | Discharge status: Home / Self Care | Payer: BC Managed Care – PPO | Attending: Emergency Medicine | Admitting: Emergency Medicine

## 2020-01-24 ENCOUNTER — Other Ambulatory Visit: Payer: Self-pay

## 2020-01-24 DIAGNOSIS — H1132 Conjunctival hemorrhage, left eye: Secondary | ICD-10-CM | POA: Diagnosis not present

## 2020-01-24 MED ORDER — HYPROMELLOSE 0.3 % OP GEL: OPHTHALMIC | 0 refills | Status: DC | PRN

## 2020-01-24 NOTE — ED Triage Notes (Signed)
Pt presents with right eye redness, appears to have ruptured vessel in eye, no pain but states that this 3d time in past few months that this has occurred

## 2020-01-24 NOTE — ED Provider Notes (Signed)
[place]   [id number] 01/24/20 Arrival Time: 6789  CC: Red eye  SUBJECTIVE:  Wayne Smith is a 49 y.o. male who presents with complaint of eye redness x 5-6 days.  Denies a precipitating event, trauma, or close contacts with similar symptoms.  Denies straining episode with sneeze, cough, BM, or vomiting.  Denies being on blood thinners, or taking aspirin daily.   Has tried OTC eye drops without relief.  Denies aggravating factors.  Denies pain.  Reports similar symptoms in the past.  Denies fever, chills, nausea, vomiting, eye pain, painful eye movements, discharge, itching, vision changes, double vision, FB sensation, periorbital erythema, blood in stool/ urine, bleeding gums, abnormal bruising.       ROS: As per HPI.  All other pertinent ROS negative.     Past Medical History:  Diagnosis Date  . Allergic rhinitis, cause unspecified   . Herpes simplex without mention of complication   . Perirectal fistula 2012   Past Surgical History:  Procedure Laterality Date  . ANAL FISTULECTOMY  01/12/11   x2  . Chest x-ray  10/29/92   Negative   No Known Allergies No current facility-administered medications on file prior to encounter.   Current Outpatient Medications on File Prior to Encounter  Medication Sig Dispense Refill  . Ascorbic Acid (VITAMIN C) 1000 MG tablet Take 1,000 mg by mouth daily.      . cetirizine (ZYRTEC) 10 MG tablet Take 10 mg by mouth daily.    . famciclovir (FAMVIR) 500 MG tablet TAKE 1 TABLET BY MOUTH THREE TIMES DAILY AS NEEDED AS DIRECTED 21 tablet 3  . Multiple Vitamin (MULTIVITAMIN) tablet Take 1 tablet by mouth daily.      Marland Kitchen triamcinolone cream (KENALOG) 0.1 % APPLY  CREAM TOPICALLY TO AFFECTED AREA(S)  TWICE DAILY 45 g 0   Social History   Socioeconomic History  . Marital status: Single    Spouse name: Not on file  . Number of children: 0  . Years of education: Not on file  . Highest education level: Not on file  Occupational History  .  Occupation: Mechanic-builds machines  Tobacco Use  . Smoking status: Never Smoker  . Smokeless tobacco: Never Used  Vaping Use  . Vaping Use: Never used  Substance and Sexual Activity  . Alcohol use: Yes    Comment: Occasional  . Drug use: No  . Sexual activity: Yes  Other Topics Concern  . Not on file  Social History Narrative   Caffeine: 2 cups coffee, sweet tea daily    Lives with GF, dogs and cats    Occupation: Dealer    Activity: no regular exercise, [place], lots of walking dogs, active at work    Diet: some water, fruits/vegetables daily, red meat once a week    Social Determinants of Health   Financial Resource Strain:   . Difficulty of Paying Living Expenses: Not on file  Food Insecurity:   . Worried About Charity fundraiser in the Last Year: Not on file  . Ran Out of Food in the Last Year: Not on file  Transportation Needs:   . Lack of Transportation (Medical): Not on file  . Lack of Transportation (Non-Medical): Not on file  Physical Activity:   . Days of Exercise per Week: Not on file  . Minutes of Exercise per Session: Not on file  Stress:   . Feeling of Stress : Not on file  Social Connections:   . Frequency  of Communication with Friends and Family: Not on file  . Frequency of Social Gatherings with Friends and Family: Not on file  . Attends Religious Services: Not on file  . Active Member of Clubs or Organizations: Not on file  . Attends Archivist Meetings: Not on file  . Marital Status: Not on file  Intimate Partner Violence:   . Fear of Current or Ex-Partner: Not on file  . Emotionally Abused: Not on file  . Physically Abused: Not on file  . Sexually Abused: Not on file   Family History  Problem Relation Age of Onset  . Stroke Mother 42  . Cancer Father 32       prostate  . Diabetes Maternal Grandmother   . Coronary artery disease Neg Hx   . Heart disease Neg Hx   . Crohn's disease Neg Hx     OBJECTIVE:  Vitals:   01/24/20  0958  BP: 138/88  Pulse: 99  Resp: 20  Temp: 98.4 F (36.9 C)  SpO2: 98%    General appearance: alert; no distress HENT: EACs clear, TMs pearly gray; nares patent; oropharynx clear Eyes: + subconjunctival erythema in 3-5 o'clock position. PERRL; EOMI without discomfort;  no obvious drainage Neck: supple Lungs: clear to auscultation bilaterally Heart: regular rate and rhythm Skin: warm and dry Psychological: alert and cooperative; normal mood and affect   ASSESSMENT & PLAN:  1. Subconjunctival hemorrhage of left eye     Meds ordered this encounter  Medications  . hypromellose (GENTEAL) 0.3 % GEL ophthalmic ointment    Sig: Place into the right eye every 4 (four) hours as needed for dry eyes.    Dispense:  10 g    Refill:  0    Order Specific Question:   Supervising Provider    Answer:   Raylene Everts [[id number]]   Rest and push fluids Avoid straining Use genteal gel eye drops as directed  Use OTC ibuprofen or tylenol as needed for pain relief Follow up with PCP or eye doctor for further evaluation Return or go to the ED if symptoms persists or worsen such as fever, chills, redness, swelling, eye pain, painful eye movements, vision changes, etc...   Reviewed expectations re: course of current medical issues. Questions answered. Outlined signs and symptoms indicating need for more acute intervention. Patient verbalized understanding. After Visit Summary given.   Lestine Box, PA-C 01/24/20 1014

## 2020-01-24 NOTE — Discharge Instructions (Addendum)
Rest and push fluids Avoid straining Use genteal gel eye drops as directed  Use OTC ibuprofen or tylenol as needed for pain relief Follow up with PCP or eye doctor for further evaluation Return or go to the ED if symptoms persists or worsen such as fever, chills, redness, swelling, eye pain, painful eye movements, vision changes, etc..Marland Kitchen

## 2020-01-30 IMAGING — DX DG CHEST 2V
2 series · 2 of 2 positions shown · non-contrast
Comparison: None.

CLINICAL DATA: Dyspnea.

EXAM:
CHEST - 2 VIEW

[chest pa]
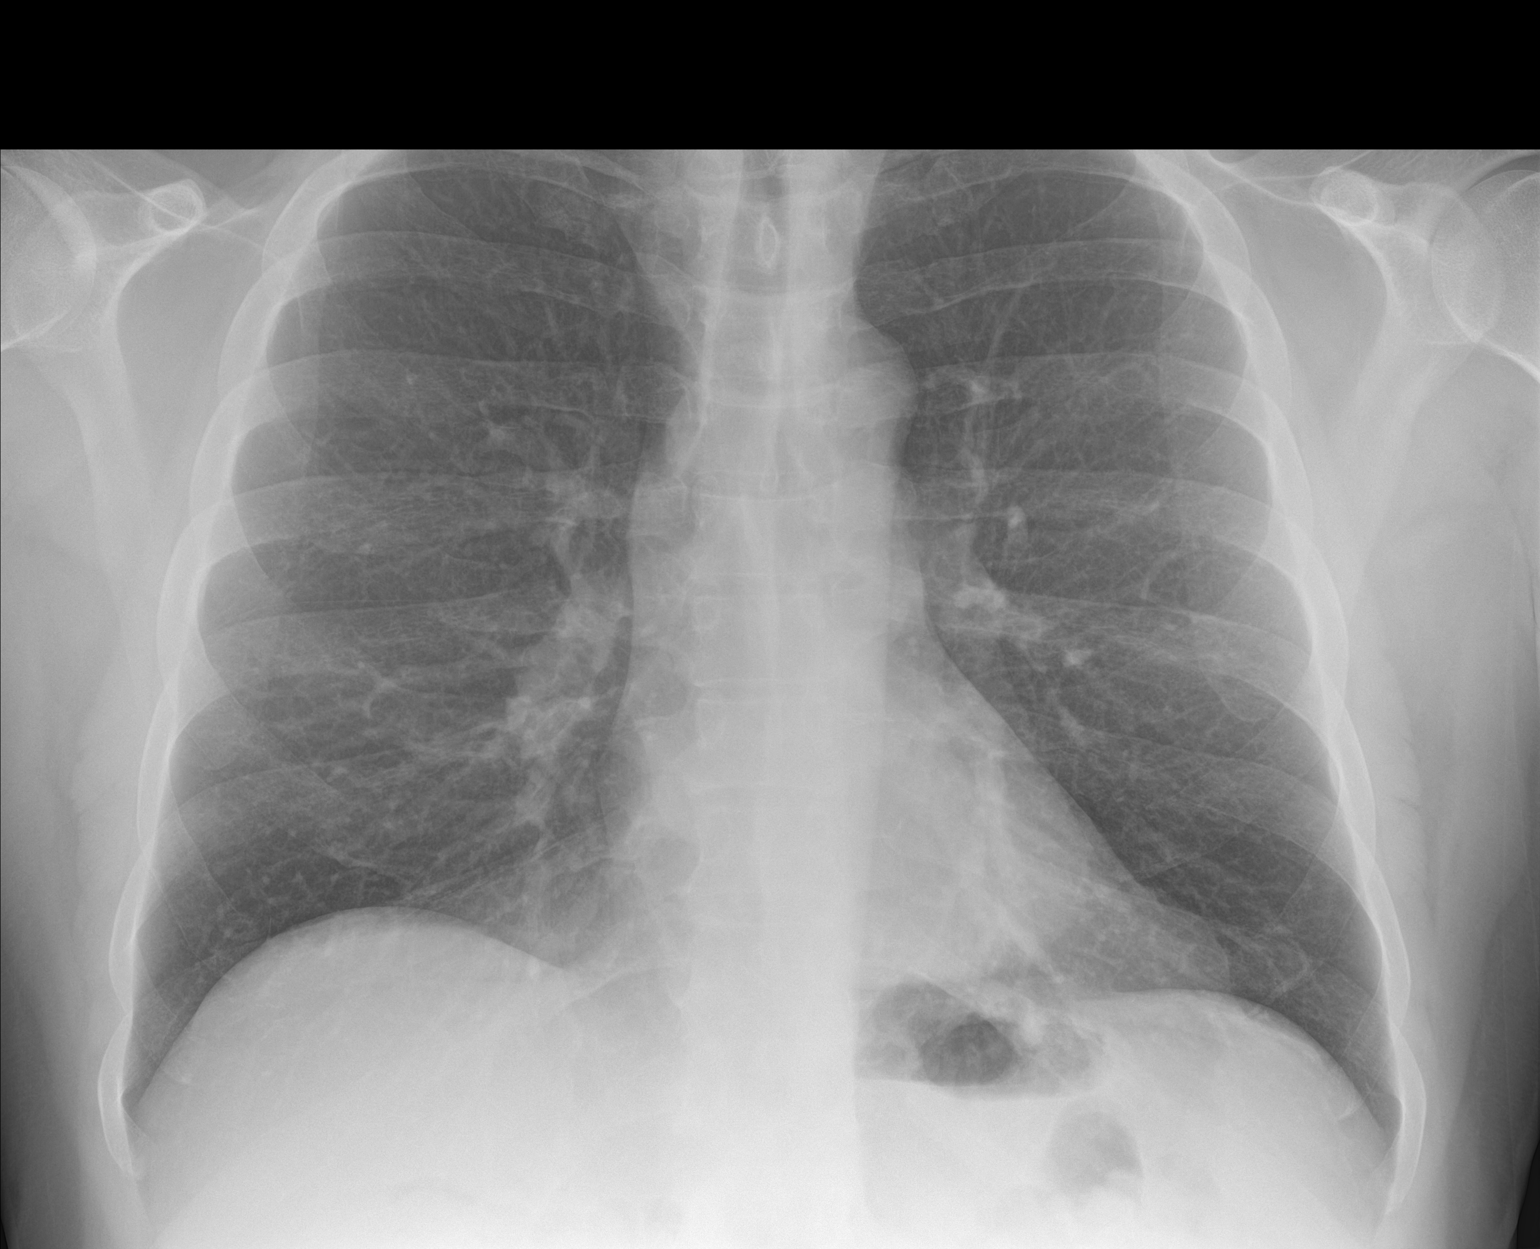

[chest lat]
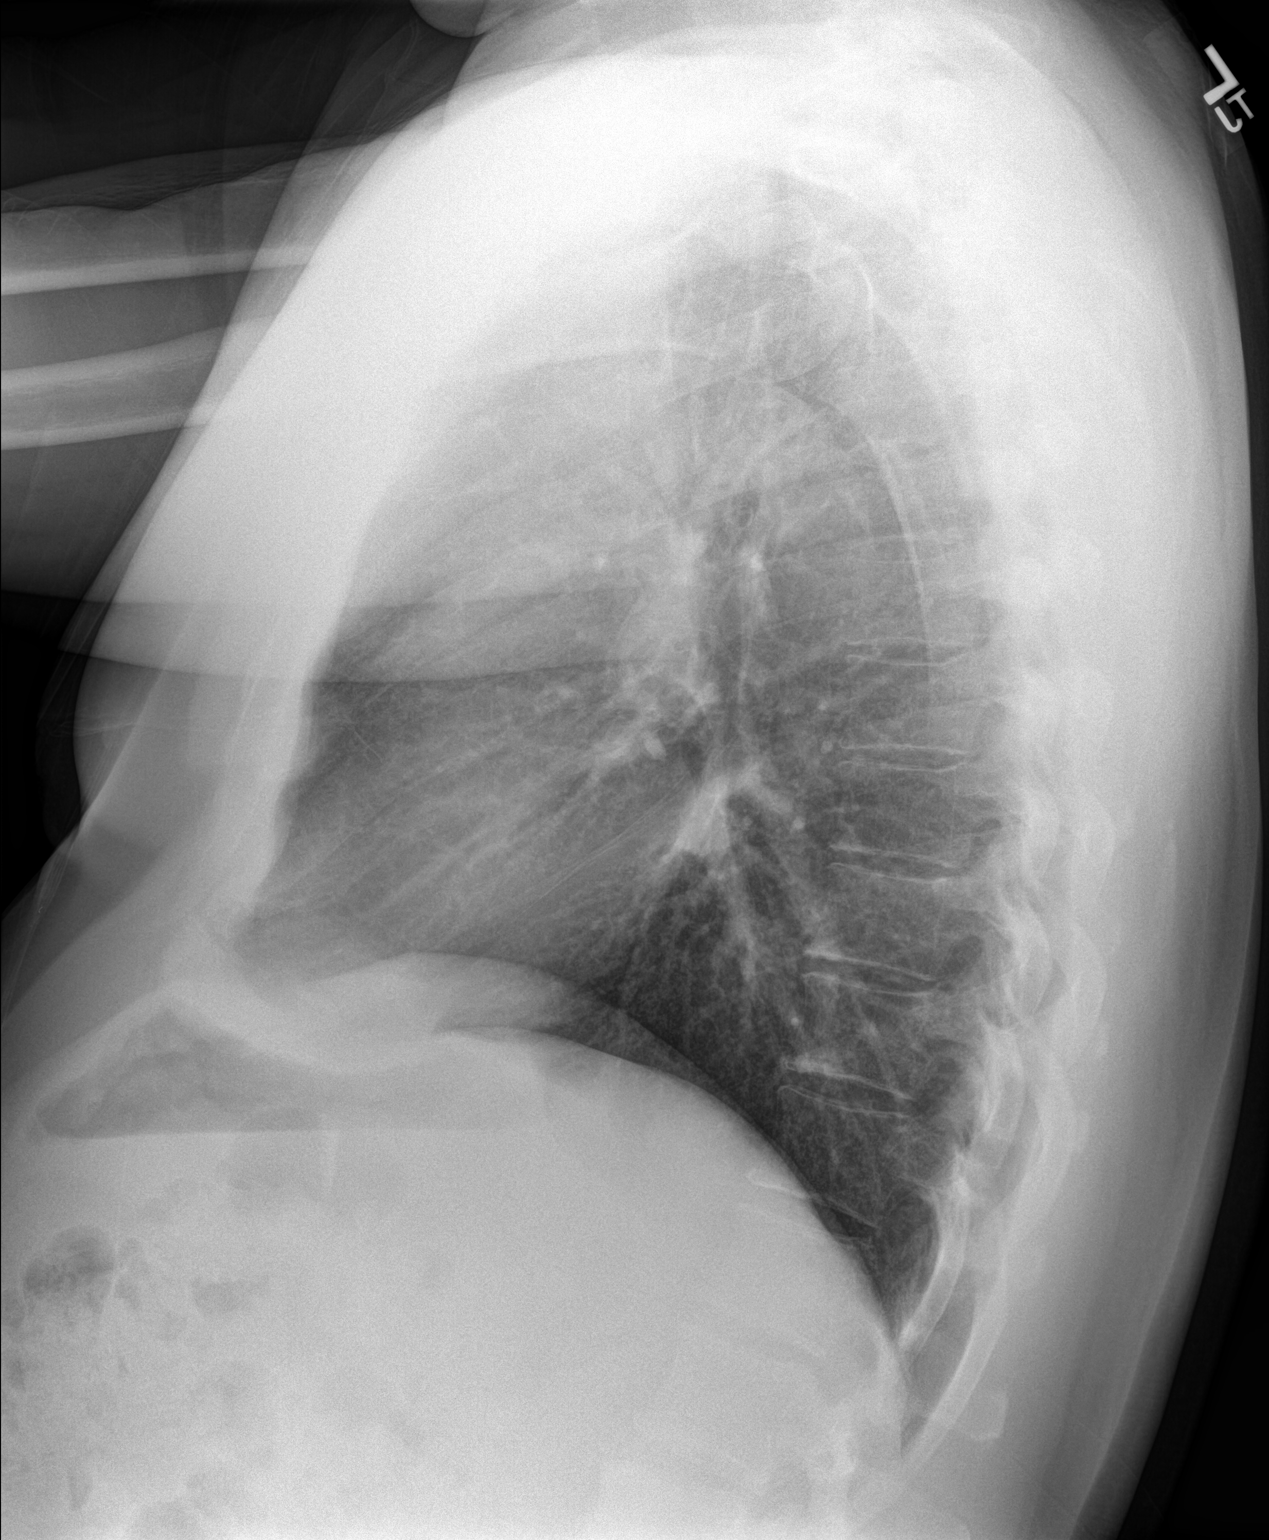

[2 of 2 positions shown; findings below may reference images not displayed]

FINDINGS: The cardiomediastinal contours are normal. The lungs are clear.
Pulmonary vasculature is normal. No consolidation, pleural effusion,
or pneumothorax. No acute osseous abnormalities are seen.
IMPRESSION: Negative radiographs of the chest.

## 2020-04-01 ENCOUNTER — Other Ambulatory Visit: Payer: Self-pay | Admitting: Family Medicine

## 2020-04-01 NOTE — Telephone Encounter (Signed)
Kenalog cream Last filled:  04/17/19, #45 g Last OV:  01/22/19, CPE Next OV:  05/26/20, CPE

## 2020-05-17 ENCOUNTER — Other Ambulatory Visit: Payer: Self-pay | Admitting: Family Medicine

## 2020-05-17 DIAGNOSIS — Z125 Encounter for screening for malignant neoplasm of prostate: Secondary | ICD-10-CM

## 2020-05-17 DIAGNOSIS — Z1159 Encounter for screening for other viral diseases: Secondary | ICD-10-CM

## 2020-05-17 DIAGNOSIS — E785 Hyperlipidemia, unspecified: Secondary | ICD-10-CM

## 2020-05-20 ENCOUNTER — Other Ambulatory Visit (INDEPENDENT_AMBULATORY_CARE_PROVIDER_SITE_OTHER): Payer: BC Managed Care – PPO

## 2020-05-20 ENCOUNTER — Other Ambulatory Visit: Payer: Self-pay

## 2020-05-20 DIAGNOSIS — E785 Hyperlipidemia, unspecified: Secondary | ICD-10-CM

## 2020-05-20 DIAGNOSIS — Z1159 Encounter for screening for other viral diseases: Secondary | ICD-10-CM | POA: Diagnosis not present

## 2020-05-20 DIAGNOSIS — Z125 Encounter for screening for malignant neoplasm of prostate: Secondary | ICD-10-CM

## 2020-05-20 LAB — LIPID PANEL
Cholesterol: 183 mg/dL (ref 0–200)
HDL: 38.1 mg/dL — ABNORMAL LOW (ref >39.00)
LDL Cholesterol: 122 mg/dL — ABNORMAL HIGH (ref 0–99)
NonHDL: 145.27
Total CHOL/HDL Ratio: 5
Triglycerides: 117 mg/dL (ref 0.0–149.0)
VLDL: 23.4 mg/dL (ref 0.0–40.0)

## 2020-05-20 LAB — COMPREHENSIVE METABOLIC PANEL
ALT: 44 U/L (ref 0–53)
AST: 24 U/L (ref 0–37)
Albumin: 4.7 g/dL (ref 3.5–5.2)
Alkaline Phosphatase: 56 U/L (ref 39–117)
BUN: 14 mg/dL (ref 6–23)
CO2: 29 mEq/L (ref 19–32)
Calcium: 9.4 mg/dL (ref 8.4–10.5)
Chloride: 105 mEq/L (ref 96–112)
Creatinine, Ser: 0.91 mg/dL (ref 0.40–1.50)
GFR: 98.15 mL/min (ref >60.00)
Glucose, Bld: 92 mg/dL (ref 70–99)
Potassium: 4.9 mEq/L (ref 3.5–5.1)
Sodium: 141 mEq/L (ref 135–145)
Total Bilirubin: 0.7 mg/dL (ref 0.2–1.2)
Total Protein: 6.6 g/dL (ref 6.0–8.3)

## 2020-05-20 LAB — PSA: PSA: 1.63 ng/mL (ref 0.10–4.00)

## 2020-05-21 LAB — HEPATITIS C ANTIBODY
Hepatitis C Ab: NONREACTIVE
SIGNAL TO CUT-OFF: 0 (ref <1.00)

## 2020-05-26 ENCOUNTER — Other Ambulatory Visit: Payer: Self-pay

## 2020-05-26 ENCOUNTER — Encounter: Payer: Self-pay | Admitting: Family Medicine

## 2020-05-26 ENCOUNTER — Ambulatory Visit (INDEPENDENT_AMBULATORY_CARE_PROVIDER_SITE_OTHER): Payer: BC Managed Care – PPO | Admitting: Family Medicine

## 2020-05-26 VITALS — BP 138/88 | HR 68 | Temp 97.2°F | Ht 67.72 in | Wt 196.6 lb

## 2020-05-26 DIAGNOSIS — Z23 Encounter for immunization: Secondary | ICD-10-CM | POA: Diagnosis not present

## 2020-05-26 DIAGNOSIS — Z1211 Encounter for screening for malignant neoplasm of colon: Secondary | ICD-10-CM

## 2020-05-26 DIAGNOSIS — Z0000 Encounter for general adult medical examination without abnormal findings: Secondary | ICD-10-CM

## 2020-05-26 DIAGNOSIS — E785 Hyperlipidemia, unspecified: Secondary | ICD-10-CM | POA: Diagnosis not present

## 2020-05-26 DIAGNOSIS — R011 Cardiac murmur, unspecified: Secondary | ICD-10-CM

## 2020-05-26 DIAGNOSIS — E669 Obesity, unspecified: Secondary | ICD-10-CM

## 2020-05-26 MED ORDER — FAMCICLOVIR 500 MG PO TABS: ORAL_TABLET | ORAL | 3 refills | Status: DC

## 2020-05-26 NOTE — Assessment & Plan Note (Signed)
Encouraged healthy diet and lifestyle choices to affect sustainable weight loss.  ?

## 2020-05-26 NOTE — Progress Notes (Signed)
Patient ID: Wayne Simmerric J Fauteux, male    DOB: 11-Dec-1970, 50 y.o.   MRN: [id number]  This visit was conducted in person.  BP 138/88 (BP Location: Left Arm, Patient Position: Sitting)   Pulse 68   Temp (!) 97.2 F (36.2 C) (Temporal)   Ht 5' 7.72" (1.72 m)   Wt 196 lb 9.6 oz (89.2 kg)   SpO2 97%   BMI 30.14 kg/m    CC: CPE Subjective:   HPI: Wayne Simmerric J Grime is a 50 y.o. male presenting on 05/26/2020 for Annual Exam   Preventative: Colon cancer screen - discussed, interested in colonoscopy, will refer Prostate cancer - does have fmhx as father age 51yo with dx- will start at age 250 Flu shotyearly COVID vaccine - discussed, declines Td 2007, Tdap 2020 Seat belt use discussed Sunscreen use discussed. No changing moles on skin. Non smoker. Wife smokes outside.  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 Interim medical history since our last visit reviewed. Allergies and medications reviewed and updated. Outpatient Medications Prior to Visit  Medication Sig Dispense Refill  . Ascorbic Acid (VITAMIN C) 1000 MG tablet Take 1,000 mg by mouth daily.      . cetirizine (ZYRTEC) 10 MG tablet Take 10 mg by mouth daily.    . Multiple Vitamin (MULTIVITAMIN) tablet Take 1 tablet by mouth daily.      Marland Kitchen. triamcinolone cream (KENALOG) 0.1 % APPLY  CREAM TOPICALLY TO AFFECTED AREA(S)  TWICE DAILY 45 g 0  . famciclovir (FAMVIR) 500 MG tablet TAKE 1 TABLET BY MOUTH THREE TIMES DAILY AS NEEDED AS DIRECTED 21 tablet 3  . hypromellose (GENTEAL) 0.3 % GEL ophthalmic ointment Place into the right eye every 4 (four) hours as needed for dry eyes. 10 g  0   No facility-administered medications prior to visit.     Per HPI unless specifically indicated in ROS section below Review of Systems  Constitutional: Negative for activity change, appetite change, chills, fatigue, fever and unexpected weight change.  HENT: Negative for hearing loss.   Eyes: Negative for visual disturbance.  Respiratory: Negative for cough, chest tightness, shortness of breath and wheezing.   Cardiovascular: Negative for chest pain, palpitations and leg swelling.  Gastrointestinal: Negative for abdominal distention, abdominal pain, blood in stool, constipation, diarrhea, nausea and vomiting.  Genitourinary: Negative for difficulty urinating and hematuria.  Musculoskeletal: Negative for arthralgias, myalgias and neck pain.  Skin: Negative for rash.  Neurological: Negative for dizziness, seizures, syncope and headaches.  Hematological: Negative for adenopathy. Does not bruise/bleed easily.  Psychiatric/Behavioral: Negative for dysphoric mood. The patient is not nervous/anxious.    Objective:  BP 138/88 (BP Location: Left Arm, Patient Position: Sitting)   Pulse 68   Temp (!) 97.2 F (36.2 C) (Temporal)   Ht 5' 7.72" (1.72 m)   Wt 196 lb 9.6 oz (89.2 kg)   SpO2 97%   BMI 30.14 kg/m   Wt Readings from Last 3 Encounters:  05/26/20 196 lb 9.6 oz (89.2 kg)  01/22/19 205 lb 2 oz (93 kg)  02/21/18 203 lb 8 oz (92.3 kg)      Physical Exam Vitals and  nursing note reviewed.  Constitutional:      General: He is not in acute distress.    Appearance: Normal appearance. He is well-developed and well-nourished. He is not ill-appearing.  HENT:     Head: Normocephalic and atraumatic.     Right Ear: Hearing, tympanic membrane, ear canal and external ear normal.     Left Ear: Hearing, tympanic membrane, ear canal and external ear normal.     Mouth/Throat:     Mouth: Oropharynx is clear and moist and mucous membranes are normal.     Pharynx: No posterior oropharyngeal  edema.  Eyes:     General: No scleral icterus.    Extraocular Movements: Extraocular movements intact and EOM normal.     Conjunctiva/sclera: Conjunctivae normal.     Pupils: Pupils are equal, round, and reactive to light.  Neck:     Thyroid: No thyroid mass or thyromegaly.  Cardiovascular:     Rate and Rhythm: Normal rate and regular rhythm.     Pulses: Normal pulses and intact distal pulses.          Radial pulses are 2+ on the right side and 2+ on the left side.     Heart sounds: Normal heart sounds. No murmur heard.   Pulmonary:     Effort: Pulmonary effort is normal. No respiratory distress.     Breath sounds: Normal breath sounds. No wheezing, rhonchi or rales.  Abdominal:     General: Abdomen is flat. Bowel sounds are normal. There is no distension.     Palpations: Abdomen is soft. There is no mass.     Tenderness: There is no abdominal tenderness. There is no guarding or rebound.     Hernia: No hernia is present.  Genitourinary:    Prostate: Normal. Not enlarged (20gm), not tender and no nodules present.     Rectum: Normal. No mass, tenderness, anal fissure, external hemorrhoid or internal hemorrhoid. Normal anal tone.  Musculoskeletal:        General: No edema. Normal range of motion.     Cervical back: Normal range of motion and neck supple.     Right lower leg: No edema.     Left lower leg: No edema.  Lymphadenopathy:     Cervical: No cervical adenopathy.  Skin:    General: Skin is warm and dry.     Findings: No rash.  Neurological:     General: No focal deficit present.     Mental Status: He is alert and oriented to person, place, and time.     Comments: CN grossly intact, station and gait intact  Psychiatric:        Mood and Affect: Mood and affect and mood normal.        Behavior: Behavior normal.        Thought Content: Thought content normal.        Judgment: Judgment normal.       Results for orders placed or performed in visit on 05/20/20   Comprehensive metabolic panel  Result Value Ref Range   Sodium 141 135 - 145 mEq/L   Potassium 4.9 3.5 - 5.1 mEq/L   Chloride 105 96 - 112 mEq/L   CO2 29 19 - 32 mEq/L   Glucose, Bld 92 70 - 99 mg/dL   BUN 14 6 - 23 mg/dL   Creatinine, Ser 5.63 0.40 - 1.50 mg/dL   Total Bilirubin 0.7 0.2 - 1.2 mg/dL   Alkaline Phosphatase 56 39 - 117 U/L  AST 24 0 - 37 U/L   ALT 44 0 - 53 U/L   Total Protein 6.6 6.0 - 8.3 g/dL   Albumin 4.7 3.5 - 5.2 g/dL   GFR 98.15 >60.00 mL/min   Calcium 9.4 8.4 - 10.5 mg/dL  Lipid panel  Result Value Ref Range   Cholesterol 183 0 - 200 mg/dL   Triglycerides 117.0 0.0 - 149.0 mg/dL   HDL 38.10 (L) >39.00 mg/dL   VLDL 23.4 0.0 - 40.0 mg/dL   LDL Cholesterol 122 (H) 0 - 99 mg/dL   Total CHOL/HDL Ratio 5    NonHDL 145.27   PSA  Result Value Ref Range   PSA 1.63 0.10 - 4.00 ng/mL  Hepatitis C antibody  Result Value Ref Range   Hepatitis C Ab NON-REACTIVE NON-REACTI   SIGNAL TO CUT-OFF 0.00 <1.00   Assessment & Plan:  This visit occurred during the SARS-CoV-2 public health emergency.  Safety protocols were in place, including screening questions prior to the visit, additional usage of staff PPE, and extensive cleaning of exam room while observing appropriate contact time as indicated for disinfecting solutions.   Problem List Items Addressed This Visit    Systolic murmur    Not appreciated today.       Obesity, Class I, BMI 30.0-34.9 (see actual BMI)    Encouraged healthy diet and lifestyle choices to affect sustainable weight loss.       Healthcare maintenance - Primary    Preventative protocols reviewed and updated unless pt declined. Discussed healthy diet and lifestyle.       Dyslipidemia    Chronic, stable off med. The 10-year ASCVD risk score Mikey Bussing DC Brooke Bonito., et al., 2013) is: 4.6%   Values used to calculate the score:     Age: 72 years     Sex: Male     Is Non-Hispanic African American: No     Diabetic: No     Tobacco smoker: No      Systolic Blood Pressure: 0000000 mmHg     Is BP treated: No     HDL Cholesterol: 38.1 mg/dL     Total Cholesterol: 183 mg/dL        Other Visit Diagnoses    Special screening for malignant neoplasms, colon       Relevant Orders   Ambulatory referral to Gastroenterology   Need for influenza vaccination       Relevant Orders   Flu Vaccine QUAD 6+ mos PF IM (Fluarix Quad PF) (Completed)   Need for shingles vaccine       Relevant Orders   Varicella-zoster vaccine IM (Shingrix) (Completed)       Meds ordered this encounter  Medications  . famciclovir (FAMVIR) 500 MG tablet    Sig: TAKE 1 TABLET BY MOUTH THREE TIMES DAILY AS NEEDED AS DIRECTED    Dispense:  21 tablet    Refill:  3   Orders Placed This Encounter  Procedures  . Flu Vaccine QUAD 6+ mos PF IM (Fluarix Quad PF)  . Varicella-zoster vaccine IM (Shingrix)  . Ambulatory referral to Gastroenterology    Referral Priority:   Routine    Referral Type:   Consultation    Referral Reason:   Specialty Services Required    Number of Visits Requested:   1    Follow up plan: No follow-ups on file.  Ria Bush, MD

## 2020-05-26 NOTE — Assessment & Plan Note (Addendum)
Preventative protocols reviewed and updated unless pt declined. Discussed healthy diet and lifestyle.  

## 2020-05-26 NOTE — Assessment & Plan Note (Signed)
Chronic, stable off med. The 10-year ASCVD risk score Denman George DC Montez Hageman., et al., 2013) is: 4.6%   Values used to calculate the score:     Age: 50 years     Sex: Male     Is Non-Hispanic African American: No     Diabetic: No     Tobacco smoker: No     Systolic Blood Pressure: 138 mmHg     Is BP treated: No     HDL Cholesterol: 38.1 mg/dL     Total Cholesterol: 183 mg/dL

## 2020-05-26 NOTE — Assessment & Plan Note (Signed)
Not appreciated today.  

## 2020-06-08 ENCOUNTER — Ambulatory Visit
Admission: EM | Admit: 2020-06-08 | Discharge: 2020-06-08 | Disposition: A | Discharge status: Home / Self Care | Payer: BC Managed Care – PPO | Attending: Family Medicine | Admitting: Family Medicine

## 2020-06-08 ENCOUNTER — Other Ambulatory Visit: Payer: Self-pay

## 2020-06-08 DIAGNOSIS — Z1152 Encounter for screening for COVID-19: Secondary | ICD-10-CM

## 2020-06-08 NOTE — ED Triage Notes (Signed)
Needs covid test

## 2020-06-10 LAB — COVID-19, FLU A+B NAA
Influenza A, NAA: NOT DETECTED
Influenza B, NAA: NOT DETECTED
SARS-CoV-2, NAA: DETECTED — AB

## 2020-06-16 ENCOUNTER — Encounter: Payer: Self-pay | Admitting: Gastroenterology

## 2020-07-14 ENCOUNTER — Ambulatory Visit (AMBULATORY_SURGERY_CENTER): Payer: Self-pay

## 2020-07-14 ENCOUNTER — Other Ambulatory Visit: Payer: Self-pay

## 2020-07-14 VITALS — Ht 67.72 in | Wt 199.0 lb

## 2020-07-14 DIAGNOSIS — Z1211 Encounter for screening for malignant neoplasm of colon: Secondary | ICD-10-CM

## 2020-07-14 DIAGNOSIS — Z01818 Encounter for other preprocedural examination: Secondary | ICD-10-CM

## 2020-07-14 MED ORDER — PLENVU 140 G PO SOLR: 1.0000 | ORAL | 0 refills | Status: DC

## 2020-07-14 NOTE — Progress Notes (Signed)
No allergies to soy or egg Pt is not on blood thinners or diet pills Denies issues with sedation/intubation Denies atrial flutter/fib Denies constipation   Emmi instructions given to pt  Pt is aware of Covid safety and care partner requirements.  

## 2020-07-21 HISTORY — PX: COLONOSCOPY: SHX174

## 2020-07-22 ENCOUNTER — Encounter: Payer: Self-pay | Admitting: Gastroenterology

## 2020-07-24 ENCOUNTER — Other Ambulatory Visit: Payer: Self-pay | Admitting: Gastroenterology

## 2020-07-24 DIAGNOSIS — Z1159 Encounter for screening for other viral diseases: Secondary | ICD-10-CM | POA: Diagnosis not present

## 2020-07-24 LAB — SARS CORONAVIRUS 2 (TAT 6-24 HRS): SARS Coronavirus 2: NEGATIVE

## 2020-07-28 ENCOUNTER — Ambulatory Visit (AMBULATORY_SURGERY_CENTER): Payer: BC Managed Care – PPO | Admitting: Gastroenterology

## 2020-07-28 ENCOUNTER — Encounter: Payer: Self-pay | Admitting: Gastroenterology

## 2020-07-28 ENCOUNTER — Other Ambulatory Visit: Payer: Self-pay

## 2020-07-28 VITALS — BP 115/71 | HR 70 | Temp 97.3°F | Resp 16 | Ht 67.0 in | Wt 199.0 lb

## 2020-07-28 DIAGNOSIS — D125 Benign neoplasm of sigmoid colon: Secondary | ICD-10-CM | POA: Diagnosis not present

## 2020-07-28 DIAGNOSIS — D123 Benign neoplasm of transverse colon: Secondary | ICD-10-CM | POA: Diagnosis not present

## 2020-07-28 DIAGNOSIS — Z1211 Encounter for screening for malignant neoplasm of colon: Secondary | ICD-10-CM | POA: Diagnosis not present

## 2020-07-28 MED ORDER — SODIUM CHLORIDE 0.9 % IV SOLN: 500.0000 mL | INTRAVENOUS | Status: DC

## 2020-07-28 NOTE — Progress Notes (Signed)
To PACU VSS. Report to [email or web address] 

## 2020-07-28 NOTE — Op Note (Signed)
[place] Patient Name: Wayne Smith Procedure Date: 07/28/2020 8:56 AM MRN: [id number] Endoscopist: Mallie Mussel L. Loletha Carrow , MD Age: 50 Referring MD:  Date of Birth: 02/27/1971 Gender: Male Account #: [id number] Procedure:                Colonoscopy Indications:              Screening for colorectal malignant neoplasm, This                            is the patient's first colonoscopy Medicines:                Monitored Anesthesia Care Procedure:                Pre-Anesthesia Assessment:                           - Prior to the procedure, a History and Physical                            was performed, and patient medications and                            allergies were reviewed. The patient's tolerance of                            previous anesthesia was also reviewed. The risks                            and benefits of the procedure and the sedation                            options and risks were discussed with the patient.                            All questions were answered, and informed consent                            was obtained. Prior Anticoagulants: The patient has                            taken no previous anticoagulant or antiplatelet                            agents. ASA Grade Assessment: II - A patient with                            mild systemic disease. After reviewing the risks                            and benefits, the patient was deemed in                            satisfactory condition to undergo the procedure.  After obtaining informed consent, the colonoscope                            was passed under direct vision. Throughout the                            procedure, the patient's blood pressure, pulse, and                            oxygen saturations were monitored continuously. The                            Olympus CF-HQ190L ([id number]) Colonoscope was                            introduced through the anus and  advanced to the the                            cecum, identified by appendiceal orifice and                            ileocecal valve. The colonoscopy was performed                            without difficulty. The patient tolerated the                            procedure well. The quality of the bowel                            preparation was good. The ileocecal valve,                            appendiceal orifice, and rectum were photographed.                            The bowel preparation used was Plenvu. Scope In: 9:09:32 AM Scope Out: 9:32:57 AM Scope Withdrawal Time: 0 hours 19 minutes 2 seconds  Total Procedure Duration: 0 hours 23 minutes 25 seconds  Findings:                 The perianal and digital rectal examinations were                            normal.                           Three sessile polyps were found in the                            recto-sigmoid colon, sigmoid colon and transverse                            colon. The polyps were 3 to 4 mm in size. These  polyps were removed with a cold snare. Resection                            and retrieval were complete.                           The exam was otherwise without abnormality on                            direct and retroflexion views. Complications:            No immediate complications. Estimated Blood Loss:     Estimated blood loss was minimal. Impression:               - Three 3 to 4 mm polyps at the recto-sigmoid                            colon, in the sigmoid colon and in the transverse                            colon, removed with a cold snare. Resected and                            retrieved.                           - The examination was otherwise normal on direct                            and retroflexion views. Recommendation:           - Patient has a contact number available for                            emergencies. The signs and symptoms of potential                             delayed complications were discussed with the                            patient. Return to normal activities tomorrow.                            Written discharge instructions were provided to the                            patient.                           - Resume previous diet.                           - Continue present medications.                           - Await pathology results.                           -  Repeat colonoscopy is recommended for                            surveillance. The colonoscopy date will be                            determined after pathology results from today's                            exam become available for review. Anmol Paschen L. Loletha Carrow, MD 07/28/2020 9:36:37 AM This report has been signed electronically.

## 2020-07-28 NOTE — Patient Instructions (Signed)
Information on polyps given to you today.  Await pathology results.  Resume previous diet and medications.  YOU HAD AN ENDOSCOPIC PROCEDURE TODAY AT THE [facility]:   Refer to the procedure report that was given to you for any specific questions about what was found during the examination.  If the procedure report does not answer your questions, please call your gastroenterologist to clarify.  If you requested that your care partner not be given the details of your procedure findings, then the procedure report has been included in a sealed envelope for you to review at your convenience later.  YOU SHOULD EXPECT: Some feelings of bloating in the abdomen. Passage of more gas than usual.  Walking can help get rid of the air that was put into your GI tract during the procedure and reduce the bloating. If you had a lower endoscopy (such as a colonoscopy or flexible sigmoidoscopy) you may notice spotting of blood in your stool or on the toilet paper. If you underwent a bowel prep for your procedure, you may not have a normal bowel movement for a few days.  Please Note:  You might notice some irritation and congestion in your nose or some drainage.  This is from the oxygen used during your procedure.  There is no need for concern and it should clear up in a day or so.  SYMPTOMS TO REPORT IMMEDIATELY:   Following lower endoscopy (colonoscopy or flexible sigmoidoscopy):  Excessive amounts of blood in the stool  Significant tenderness or worsening of abdominal pains  Swelling of the abdomen that is new, acute  Fever of 100F or higher   For urgent or emergent issues, a gastroenterologist can be reached at any hour by calling [phone]. Do not use MyChart messaging for urgent concerns.    DIET:  We do recommend a small meal at first, but then you may proceed to your regular diet.  Drink plenty of fluids but you should avoid alcoholic beverages for 24 hours.  ACTIVITY:  You should  plan to take it easy for the rest of today and you should NOT DRIVE or use heavy machinery until tomorrow (because of the sedation medicines used during the test).    FOLLOW UP: Our staff will call the number listed on your records 48-72 hours following your procedure to check on you and address any questions or concerns that you may have regarding the information given to you following your procedure. If we do not reach you, we will leave a message.  We will attempt to reach you two times.  During this call, we will ask if you have developed any symptoms of COVID 19. If you develop any symptoms (ie: fever, flu-like symptoms, shortness of breath, cough etc.) before then, please call [phone].  If you test positive for Covid 19 in the 2 weeks post procedure, please call and report this information to us.    If any biopsies were taken you will be contacted by phone or by letter within the next 1-3 weeks.  Please call us at [phone] if you have not heard about the biopsies in 3 weeks.    SIGNATURES/CONFIDENTIALITY: You and/or your care partner have signed paperwork which will be entered into your electronic medical record.  These signatures attest to the fact that that the information above on your After Visit Summary has been reviewed and is understood.  Full responsibility of the confidentiality of this discharge information lies with you and/or your care-partner. 

## 2020-07-28 NOTE — Progress Notes (Signed)
Called to room to assist during endoscopic procedure.  Patient ID and intended procedure confirmed with present staff. Received instructions for my participation in the procedure from the performing physician.  

## 2020-07-30 ENCOUNTER — Telehealth: Payer: Self-pay

## 2020-07-30 ENCOUNTER — Telehealth: Payer: Self-pay | Admitting: *Deleted

## 2020-07-30 NOTE — Telephone Encounter (Signed)
First attempt follow up call to pt, lm on vm 

## 2020-07-30 NOTE — Telephone Encounter (Signed)
  Follow up Call-  Call back number 07/28/2020  Post procedure Call Back phone  # [phone]  Permission to leave phone message Yes  Some recent data might be hidden     Patient questions:  Do you have a fever, pain , or abdominal swelling? No. Pain Score  0 *  Have you tolerated food without any problems? Yes.    Have you been able to return to your normal activities? Yes.    Do you have any questions about your discharge instructions: Diet   No. Medications  No. Follow up visit  No.  Do you have questions or concerns about your Care? No.  Actions: * If pain score is 4 or above: No action needed, pain <4.  1. Have you developed a fever since your procedure? no  2.   Have you had an respiratory symptoms (SOB or cough) since your procedure? no  3.   Have you tested positive for COVID 19 since your procedure no  4.   Have you had any family members/close contacts diagnosed with the COVID 19 since your procedure?  no   If yes to any of these questions please route to Joylene John, RN and Joella Prince, RN

## 2020-08-03 ENCOUNTER — Encounter: Payer: Self-pay | Admitting: Gastroenterology

## 2020-08-04 ENCOUNTER — Other Ambulatory Visit: Payer: Self-pay

## 2020-08-04 ENCOUNTER — Encounter: Payer: Self-pay | Admitting: Emergency Medicine

## 2020-08-04 ENCOUNTER — Ambulatory Visit
Admission: EM | Admit: 2020-08-04 | Discharge: 2020-08-04 | Disposition: A | Discharge status: Home / Self Care | Payer: BC Managed Care – PPO | Attending: Emergency Medicine | Admitting: Emergency Medicine

## 2020-08-04 ENCOUNTER — Telehealth: Payer: Self-pay | Admitting: Family Medicine

## 2020-08-04 DIAGNOSIS — M545 Low back pain, unspecified: Secondary | ICD-10-CM | POA: Diagnosis not present

## 2020-08-04 MED ORDER — CYCLOBENZAPRINE HCL 10 MG PO TABS: 10.0000 mg | ORAL_TABLET | Freq: Every day | ORAL | 0 refills | Status: DC

## 2020-08-04 MED ORDER — PREDNISONE 20 MG PO TABS: 20.0000 mg | ORAL_TABLET | Freq: Two times a day (BID) | ORAL | 0 refills | Status: AC

## 2020-08-04 NOTE — ED Provider Notes (Signed)
[facility]   [id number] 08/04/20 Arrival Time: 4268  CC: Back PAIN  SUBJECTIVE: History from: patient. Wayne Smith is a 50 y.o. male complains of bilateral low back pain that began 1 day ago.  Bent down to dry foot off while getting out of shower and felt a pop in back.  Localizes the pain to the bilateral low back.  Describes the pain as intermittent and sharp in character.  Has tried OTC medications without relief.  Symptoms are made worse with movement and getting up and out of chair.  Reports similar symptoms in the past.  Denies fever, chills, erythema, ecchymosis, effusion, weakness, numbness and tingling, saddle paresthesias, loss of bowel or bladder function.      ROS: As per HPI.  All other pertinent ROS negative.     Past Medical History:  Diagnosis Date  . Allergic rhinitis, cause unspecified   . Allergy    seasonal  . Heart murmur    PCP heard it once and then not again--several yrs ago  . Herpes simplex without mention of complication   . Perirectal fistula 2012   Past Surgical History:  Procedure Laterality Date  . ANAL FISTULECTOMY  01/12/11   x2  . Chest x-ray  10/29/92   Negative   No Known Allergies Current Facility-Administered Medications on File Prior to Encounter  Medication Dose Route Frequency Provider Last Rate Last Admin  . 0.9 %  sodium chloride infusion  500 mL Intravenous Continuous Doran Stabler, MD       Current Outpatient Medications on File Prior to Encounter  Medication Sig Dispense Refill  . Ascorbic Acid (VITAMIN C) 1000 MG tablet Take 1,000 mg by mouth daily.    . cetirizine (ZYRTEC) 10 MG tablet Take 10 mg by mouth daily.    . famciclovir (FAMVIR) 500 MG tablet TAKE 1 TABLET BY MOUTH THREE TIMES DAILY AS NEEDED AS DIRECTED 21 tablet 3  . Multiple Vitamin (MULTIVITAMIN) tablet Take 1 tablet by mouth daily.    Marland Kitchen triamcinolone cream (KENALOG) 0.1 % APPLY  CREAM TOPICALLY TO AFFECTED AREA(S)  TWICE DAILY 45 g 0   Social History    Socioeconomic History  . Marital status: Single    Spouse name: Not on file  . Number of children: 0  . Years of education: Not on file  . Highest education level: Not on file  Occupational History  . Occupation: Mechanic-builds machines  Tobacco Use  . Smoking status: Never Smoker  . Smokeless tobacco: Never Used  Vaping Use  . Vaping Use: Never used  Substance and Sexual Activity  . Alcohol use: Yes    Comment: Occasional  . Drug use: No  . Sexual activity: Yes  Other Topics Concern  . Not on file  Social History Narrative   Caffeine: 2 cups coffee, sweet tea daily    Lives with GF, dogs and cats    Occupation: Dealer    Activity: no regular exercise, [place], lots of walking dogs, active at work    Diet: some water, fruits/vegetables daily, red meat once a week    Social Determinants of Radio broadcast assistant Strain: Not on file  Food Insecurity: Not on file  Transportation Needs: Not on file  Physical Activity: Not on file  Stress: Not on file  Social Connections: Not on file  Intimate Partner Violence: Not on file   Family History  Problem Relation Age of Onset  . Stroke Mother 57  .  Cancer Father 10       prostate  . Diabetes Maternal Grandmother   . Coronary artery disease Neg Hx   . Heart disease Neg Hx   . Crohn's disease Neg Hx   . Colon cancer Neg Hx   . Colon polyps Neg Hx   . Esophageal cancer Neg Hx   . Rectal cancer Neg Hx   . Stomach cancer Neg Hx     OBJECTIVE:  Vitals:   08/04/20 1550  BP: (!) 164/97  Pulse: 83  Resp: 18  Temp: 97.9 F (36.6 C)  TempSrc: Oral  SpO2: 96%    General appearance: ALERT; in no acute distress.  Head: NCAT Lungs: Normal respiratory effort Musculoskeletal: Back   Inspection: Skin warm, dry, clear and intact without obvious erythema, effusion, or ecchymosis.  Palpation: TTP over low back ROM: FROM active and passive Strength: 5/5 hip flexion, 5/5 hip extension Skin: warm and  dry Neurologic: Ambulates without difficulty; Sensation intact about the lower extremities Psychological: alert and cooperative; normal mood and affect  ASSESSMENT & PLAN:  1. Acute bilateral low back pain without sciatica     Meds ordered this encounter  Medications  . predniSONE (DELTASONE) 20 MG tablet    Sig: Take 1 tablet (20 mg total) by mouth 2 (two) times daily with a meal for 5 days.    Dispense:  10 tablet    Refill:  0    Order Specific Question:   Supervising Provider    Answer:   Raylene Everts [[id number]]  . cyclobenzaprine (FLEXERIL) 10 MG tablet    Sig: Take 1 tablet (10 mg total) by mouth at bedtime.    Dispense:  15 tablet    Refill:  0    Order Specific Question:   Supervising Provider    Answer:   Raylene Everts [[id number]]    Continue conservative management of rest, ice, and gentle stretches Prednisone prescribed.  Take as directed and to completion Take cyclobenzaprine at nighttime for symptomatic relief. Avoid driving or operating heavy machinery while using medication. Follow up with PCP if symptoms persist Return or go to the ER if you have any new or worsening symptoms (fever, chills, chest pain, abdominal pain, changes in bowel or bladder habits, pain radiating into lower legs, etc...)   Reviewed expectations re: course of current medical issues. Questions answered. Outlined signs and symptoms indicating need for more acute intervention. Patient verbalized understanding. After Visit Summary given.    Lestine Box, PA-C 08/04/20 1601

## 2020-08-04 NOTE — Telephone Encounter (Signed)
Patient called in stating that his back "went out" again. I do have him scheduled with Damita Dunnings on Friday to be seen but he wanted you to know what was going on. EM

## 2020-08-04 NOTE — ED Triage Notes (Signed)
Lower back pain.  Was getting out of shower and felt some thing pop.  No known injury.

## 2020-08-04 NOTE — Discharge Instructions (Signed)
Continue conservative management of rest, ice, and gentle stretches Prednisone prescribed.  Take as directed and to completion Take cyclobenzaprine at nighttime for symptomatic relief. Avoid driving or operating heavy machinery while using medication. Follow up with PCP if symptoms persist Return or go to the ER if you have any new or worsening symptoms (fever, chills, chest pain, abdominal pain, changes in bowel or bladder habits, pain radiating into lower legs, etc...)  

## 2020-08-04 NOTE — Telephone Encounter (Addendum)
Seen at [facility] today, treated with flexeril and prednisone. plz call tomorrow for update on symptoms with medications.

## 2020-08-05 ENCOUNTER — Encounter: Payer: Self-pay | Admitting: Family Medicine

## 2020-08-05 NOTE — Telephone Encounter (Signed)
Lvm asking pt to call back.  Need an update on pt.  

## 2020-08-05 NOTE — Telephone Encounter (Signed)
Pt responded via MyChart (see Pt Msg, 08/05/20).

## 2020-08-07 ENCOUNTER — Ambulatory Visit: Payer: BC Managed Care – PPO | Admitting: Family Medicine

## 2020-08-07 ENCOUNTER — Encounter: Payer: Self-pay | Admitting: Family Medicine

## 2020-08-07 ENCOUNTER — Other Ambulatory Visit: Payer: Self-pay

## 2020-08-07 DIAGNOSIS — M545 Low back pain, unspecified: Secondary | ICD-10-CM

## 2020-08-07 NOTE — Progress Notes (Signed)
This visit occurred during the SARS-CoV-2 public health emergency.  Safety protocols were in place, including screening questions prior to the visit, additional usage of staff PPE, and extensive cleaning of exam room while observing appropriate contact time as indicated for disinfecting solutions.  B lower back pain.  Pain started 08/03/20.  Bent over and back pain started, when to floor.  Similar event in 2019.  Had trouble getting up.  Was in bed most of the day thereafter.  To UC 08/04/20.  Eval d/w pt. Still on prednisone, started 3 days ago.  Taking flexeril at night but not last night.    No leg pain.  No B/B sx.  No FCNAVD.  He is clearly better but not back to baseline in the meantime.  Yesterday he was able to walk more easily compared to prev.    He had back exercises to use at home.  D/w pt about restarting.  He still has the handout from before.  Meds, vitals, and allergies reviewed.   ROS: Per HPI unless specifically indicated in ROS section   nad ncat Neck supple, no LA rrr ctab abd soft, not ttp Straight leg raise negative bilaterally Able to bear weight. B lower back ttp, midline not ttp.

## 2020-08-07 NOTE — Patient Instructions (Signed)
Finish the prednisone.  Take it with food.  Don't take aleve or ibuprofen while on prednisone.    Flexeril at night if needed.  Use heat and ice as needed.  Start back on the spine exercises/stretches.  Try to go back to work on 08/10/20.

## 2020-08-09 NOTE — Assessment & Plan Note (Signed)
Improved from prior. Finish the prednisone.  Take it with food.  Don't take aleve or ibuprofen while on prednisone.  Routine steroid and NSAID cautions discussed with patient.  Flexeril at night if needed.  Sedation caution. Use heat and ice as needed.  Start back on the spine exercises/stretches.  Try to go back to work on 08/10/20.   Update [place] as needed.  He agrees.

## 2020-08-25 ENCOUNTER — Other Ambulatory Visit: Payer: Self-pay

## 2020-08-25 ENCOUNTER — Ambulatory Visit (INDEPENDENT_AMBULATORY_CARE_PROVIDER_SITE_OTHER): Payer: BC Managed Care – PPO

## 2020-08-25 DIAGNOSIS — Z23 Encounter for immunization: Secondary | ICD-10-CM | POA: Diagnosis not present

## 2020-08-25 NOTE — Progress Notes (Addendum)
Per orders of Dr. Ria Bush, injection of Shingles given by Francella Solian in left deltoid. Patient tolerated injection well.  VIS given to patient and updated [facility]

## 2020-11-03 ENCOUNTER — Other Ambulatory Visit: Payer: Self-pay | Admitting: Family Medicine

## 2020-11-04 NOTE — Telephone Encounter (Signed)
Kenalog cream Last filled:  04/02/20, #45 g Last OV:  05/26/20,  CPE Next OV:  none

## 2020-11-27 DIAGNOSIS — D3131 Benign neoplasm of right choroid: Secondary | ICD-10-CM | POA: Diagnosis not present

## 2020-11-27 DIAGNOSIS — H25013 Cortical age-related cataract, bilateral: Secondary | ICD-10-CM | POA: Diagnosis not present

## 2020-11-27 DIAGNOSIS — H35033 Hypertensive retinopathy, bilateral: Secondary | ICD-10-CM | POA: Diagnosis not present

## 2020-11-27 DIAGNOSIS — H524 Presbyopia: Secondary | ICD-10-CM | POA: Diagnosis not present

## 2020-11-27 DIAGNOSIS — H35013 Changes in retinal vascular appearance, bilateral: Secondary | ICD-10-CM | POA: Diagnosis not present

## 2021-02-09 DIAGNOSIS — D225 Melanocytic nevi of trunk: Secondary | ICD-10-CM | POA: Diagnosis not present

## 2021-02-09 DIAGNOSIS — L570 Actinic keratosis: Secondary | ICD-10-CM | POA: Diagnosis not present

## 2021-02-09 DIAGNOSIS — L814 Other melanin hyperpigmentation: Secondary | ICD-10-CM | POA: Diagnosis not present

## 2021-02-09 DIAGNOSIS — L821 Other seborrheic keratosis: Secondary | ICD-10-CM | POA: Diagnosis not present

## 2021-06-14 ENCOUNTER — Telehealth: Payer: Self-pay | Admitting: Family Medicine

## 2021-06-19 ENCOUNTER — Other Ambulatory Visit: Payer: Self-pay | Admitting: Family Medicine

## 2021-06-19 DIAGNOSIS — Z125 Encounter for screening for malignant neoplasm of prostate: Secondary | ICD-10-CM

## 2021-06-19 DIAGNOSIS — E785 Hyperlipidemia, unspecified: Secondary | ICD-10-CM

## 2021-06-23 ENCOUNTER — Other Ambulatory Visit: Payer: Self-pay

## 2021-06-23 ENCOUNTER — Other Ambulatory Visit (INDEPENDENT_AMBULATORY_CARE_PROVIDER_SITE_OTHER): Payer: BC Managed Care – PPO

## 2021-06-23 DIAGNOSIS — E785 Hyperlipidemia, unspecified: Secondary | ICD-10-CM

## 2021-06-23 DIAGNOSIS — Z125 Encounter for screening for malignant neoplasm of prostate: Secondary | ICD-10-CM

## 2021-06-23 LAB — LIPID PANEL
Cholesterol: 186 mg/dL (ref 0–200)
HDL: 35.8 mg/dL — ABNORMAL LOW (ref >39.00)
LDL Cholesterol: 129 mg/dL — ABNORMAL HIGH (ref 0–99)
NonHDL: 149.94
Total CHOL/HDL Ratio: 5
Triglycerides: 104 mg/dL (ref 0.0–149.0)
VLDL: 20.8 mg/dL (ref 0.0–40.0)

## 2021-06-23 LAB — COMPREHENSIVE METABOLIC PANEL
ALT: 50 U/L (ref 0–53)
AST: 26 U/L (ref 0–37)
Albumin: 4.5 g/dL (ref 3.5–5.2)
Alkaline Phosphatase: 53 U/L (ref 39–117)
BUN: 13 mg/dL (ref 6–23)
CO2: 32 mEq/L (ref 19–32)
Calcium: 9.5 mg/dL (ref 8.4–10.5)
Chloride: 104 mEq/L (ref 96–112)
Creatinine, Ser: 0.92 mg/dL (ref 0.40–1.50)
GFR: 96.13 mL/min (ref >60.00)
Glucose, Bld: 93 mg/dL (ref 70–99)
Potassium: 4.8 mEq/L (ref 3.5–5.1)
Sodium: 141 mEq/L (ref 135–145)
Total Bilirubin: 0.6 mg/dL (ref 0.2–1.2)
Total Protein: 6.7 g/dL (ref 6.0–8.3)

## 2021-06-23 LAB — PSA: PSA: 1.36 ng/mL (ref 0.10–4.00)

## 2021-06-23 LAB — TSH: TSH: 1.37 u[IU]/mL (ref 0.35–5.50)

## 2021-06-28 ENCOUNTER — Encounter: Payer: Self-pay | Admitting: Family Medicine

## 2021-06-28 ENCOUNTER — Ambulatory Visit (INDEPENDENT_AMBULATORY_CARE_PROVIDER_SITE_OTHER): Payer: BC Managed Care – PPO | Admitting: Family Medicine

## 2021-06-28 ENCOUNTER — Other Ambulatory Visit: Payer: Self-pay

## 2021-06-28 VITALS — BP 134/82 | HR 93 | Temp 97.8°F | Ht 67.75 in | Wt 203.4 lb

## 2021-06-28 DIAGNOSIS — E785 Hyperlipidemia, unspecified: Secondary | ICD-10-CM | POA: Diagnosis not present

## 2021-06-28 DIAGNOSIS — R011 Cardiac murmur, unspecified: Secondary | ICD-10-CM | POA: Diagnosis not present

## 2021-06-28 DIAGNOSIS — Z0000 Encounter for general adult medical examination without abnormal findings: Secondary | ICD-10-CM | POA: Diagnosis not present

## 2021-06-28 DIAGNOSIS — E669 Obesity, unspecified: Secondary | ICD-10-CM

## 2021-06-28 DIAGNOSIS — H9319 Tinnitus, unspecified ear: Secondary | ICD-10-CM

## 2021-06-28 MED ORDER — TRIAMCINOLONE ACETONIDE 0.1 % EX CREA: TOPICAL_CREAM | CUTANEOUS | 1 refills | Status: DC

## 2021-06-28 NOTE — Assessment & Plan Note (Signed)
Encouraged healthy diet and lifestyle choices to affect sustainable weight loss.  ?

## 2021-06-28 NOTE — Patient Instructions (Addendum)
You are doing well today Return as needed or in 1 year for next physical.  Hearing screen today.   Health Maintenance, Male Adopting a healthy lifestyle and getting preventive care are important in promoting health and wellness. Ask your health care provider about: The right schedule for you to have regular tests and exams. Things you can do on your own to prevent diseases and keep yourself healthy. What should I know about diet, weight, and exercise? Eat a healthy diet  Eat a diet that includes plenty of vegetables, fruits, low-fat dairy products, and lean protein. Do not eat a lot of foods that are high in solid fats, added sugars, or sodium. Maintain a healthy weight Body mass index (BMI) is a measurement that can be used to identify possible weight problems. It estimates body fat based on height and weight. Your health care provider can help determine your BMI and help you achieve or maintain a healthy weight. Get regular exercise Get regular exercise. This is one of the most important things you can do for your health. Most adults should: Exercise for at least 150 minutes each week. The exercise should increase your heart rate and make you sweat (moderate-intensity exercise). Do strengthening exercises at least twice a week. This is in addition to the moderate-intensity exercise. Spend less time sitting. Even light physical activity can be beneficial. Watch cholesterol and blood lipids Have your blood tested for lipids and cholesterol at 52 years of age, then have this test every 5 years. You may need to have your cholesterol levels checked more often if: Your lipid or cholesterol levels are high. You are older than 52 years of age. You are at high risk for heart disease. What should I know about cancer screening? Many types of cancers can be detected early and may often be prevented. Depending on your health history and family history, you may need to have cancer screening at various  ages. This may include screening for: Colorectal cancer. Prostate cancer. Skin cancer. Lung cancer. What should I know about heart disease, diabetes, and high blood pressure? Blood pressure and heart disease High blood pressure causes heart disease and increases the risk of stroke. This is more likely to develop in people who have high blood pressure readings or are overweight. Talk with your health care provider about your target blood pressure readings. Have your blood pressure checked: Every 3-5 years if you are 51-47 years of age. Every year if you are 70 years old or older. If you are between the ages of 80 and 53 and are a current or former smoker, ask your health care provider if you should have a one-time screening for abdominal aortic aneurysm (AAA). Diabetes Have regular diabetes screenings. This checks your fasting blood sugar level. Have the screening done: Once every three years after age 47 if you are at a normal weight and have a low risk for diabetes. More often and at a younger age if you are overweight or have a high risk for diabetes. What should I know about preventing infection? Hepatitis B If you have a higher risk for hepatitis B, you should be screened for this virus. Talk with your health care provider to find out if you are at risk for hepatitis B infection. Hepatitis C Blood testing is recommended for: Everyone born from [birth year] through 1966. Anyone with known risk factors for hepatitis C. Sexually transmitted infections (STIs) You should be screened each year for STIs, including gonorrhea and chlamydia, if: You  are sexually active and are younger than 51 years of age. You are older than 52 years of age and your health care provider tells you that you are at risk for this type of infection. Your sexual activity has changed since you were last screened, and you are at increased risk for chlamydia or gonorrhea. Ask your health care provider if you are at risk. Ask  your health care provider about whether you are at high risk for HIV. Your health care provider may recommend a prescription medicine to help prevent HIV infection. If you choose to take medicine to prevent HIV, you should first get tested for HIV. You should then be tested every 3 months for as long as you are taking the medicine. Follow these instructions at home: Alcohol use Do not drink alcohol if your health care provider tells you not to drink. If you drink alcohol: Limit how much you have to 0-2 drinks a day. Know how much alcohol is in your drink. In the [place] one drink equals one 12 oz bottle of beer (355 mL), one 5 oz glass of wine (148 mL), or one 1 oz glass of hard liquor (44 mL). Lifestyle Do not use any products that contain nicotine or tobacco. These products include cigarettes, chewing tobacco, and vaping devices, such as e-cigarettes. If you need help quitting, ask your health care provider. Do not use street drugs. Do not share needles. Ask your health care provider for help if you need support or information about quitting drugs. General instructions Schedule regular health, dental, and eye exams. Stay current with your vaccines. Tell your health care provider if: You often feel depressed. You have ever been abused or do not feel safe at home. Summary Adopting a healthy lifestyle and getting preventive care are important in promoting health and wellness. Follow your health care provider's instructions about healthy diet, exercising, and getting tested or screened for diseases. Follow your health care provider's instructions on monitoring your cholesterol and blood pressure. This information is not intended to replace advice given to you by your health care provider. Make sure you discuss any questions you have with your health care provider. Document Revised: 09/28/2020 Document Reviewed: 09/28/2020 Elsevier Patient Education  [place].

## 2021-06-28 NOTE — Assessment & Plan Note (Signed)
Preventative protocols reviewed and updated unless pt declined. Discussed healthy diet and lifestyle.  

## 2021-06-28 NOTE — Assessment & Plan Note (Signed)
Not appreciated 

## 2021-06-28 NOTE — Assessment & Plan Note (Signed)
Longstanding, L>R.  H/o significant noise exposure.  Encouraged ear protection.  Check hearing screen today.

## 2021-06-28 NOTE — Progress Notes (Signed)
Patient ID: Wayne Smith, male    DOB: 14-Aug-1970, 51 y.o.   MRN: [id number]  This visit was conducted in person.  BP 134/82    Pulse 93    Temp 97.8 F (36.6 C) (Temporal)    Ht 5' 7.75" (1.721 m)    Wt 203 lb 7 oz (92.3 kg)    SpO2 97%    BMI 31.16 kg/m   Hearing Screening   500Hz  1000Hz  2000Hz  4000Hz   Right ear 20 20 40 40  Left ear 20 40 40 0    CC: CPE Subjective:   HPI: Wayne Smith is a 51 y.o. male presenting on 06/28/2021 for Annual Exam   L>R tinnitus present for years, may be worsening. Longstanding h/o loud noise exposure. Notes worsening hearing as well.   Preventative: Colonoscopy 07/2020 - TAx3, HP, rpt 7 yrs ([facility])  Prostate cancer - does have fmhx as father age 35yo with dx - will start at age 31  Lung cancer screening - not eligible  Flu shot yearly COVID vaccine - discussed, declines Td 2007, Tdap 2020 Shingrix 05/2020, 08/2020 Seat belt use discussed Sunscreen use discussed. No changing moles on skin. Sees derm yearly.  Sleep - averaging 7-8 hours/night, notes some sleep maintenance insomnia  Non smoker. Wife smokes outside.  Alcohol - occasional on weekends Dentist - q6 mo  Eye exam yearly    Caffeine: 1 cup coffee, sweet tea throughout the day  Lives with wife, dog and cats  Occupation: Development worker, [employer] - desk job Activity: wants to start riding bicycle  Diet: some water, fruits/vegetables daily, red meat once a week      Relevant past medical, surgical, family and social history reviewed and updated as indicated. Interim medical history since our last visit reviewed. Allergies and medications reviewed and updated. Outpatient Medications Prior to Visit  Medication Sig Dispense Refill   Ascorbic Acid (VITAMIN C) 1000 MG tablet Take 1,000 mg by mouth daily.     cetirizine (ZYRTEC) 10 MG tablet Take 10 mg by mouth daily.     famciclovir (FAMVIR) 500 MG tablet TAKE 1 TABLET BY MOUTH THREE TIMES DAILY AS NEEDED AS DIRECTED 21 tablet 3    Multiple Vitamin (MULTIVITAMIN) tablet Take 1 tablet by mouth daily.     triamcinolone cream (KENALOG) 0.1 % APPLY  CREAM TOPICALLY TO AFFECTED AREA(S)  TWICE DAILY 45 g 0   cyclobenzaprine (FLEXERIL) 10 MG tablet Take 1 tablet (10 mg total) by mouth at bedtime. 15 tablet 0   No facility-administered medications prior to visit.     Per HPI unless specifically indicated in ROS section below Review of Systems  Constitutional:  Negative for activity change, appetite change, chills, fatigue, fever and unexpected weight change.  HENT:  Negative for hearing loss.   Eyes:  Negative for visual disturbance.  Respiratory:  Negative for cough, chest tightness, shortness of breath and wheezing.   Cardiovascular:  Negative for chest pain, palpitations and leg swelling.  Gastrointestinal:  Negative for abdominal distention, abdominal pain, blood in stool, constipation, diarrhea, nausea and vomiting.  Genitourinary:  Negative for difficulty urinating and hematuria.  Musculoskeletal:  Negative for arthralgias, myalgias and neck pain.  Skin:  Negative for rash.  Neurological:  Negative for dizziness, seizures, syncope and headaches.  Hematological:  Negative for adenopathy. Does not bruise/bleed easily.  Psychiatric/Behavioral:  Negative for dysphoric mood. The patient is not nervous/anxious.    Objective:  BP 134/82    Pulse 93  Temp 97.8 F (36.6 C) (Temporal)    Ht 5' 7.75" (1.721 m)    Wt 203 lb 7 oz (92.3 kg)    SpO2 97%    BMI 31.16 kg/m   Wt Readings from Last 3 Encounters:  06/28/21 203 lb 7 oz (92.3 kg)  08/07/20 196 lb (88.9 kg)  07/28/20 199 lb (90.3 kg)      Physical Exam Vitals and nursing note reviewed.  Constitutional:      General: He is not in acute distress.    Appearance: Normal appearance. He is well-developed. He is not ill-appearing.  HENT:     Head: Normocephalic and atraumatic.     Right Ear: Hearing, tympanic membrane, ear canal and external ear normal.     Left Ear:  Hearing, tympanic membrane, ear canal and external ear normal.  Eyes:     General: No scleral icterus.    Extraocular Movements: Extraocular movements intact.     Conjunctiva/sclera: Conjunctivae normal.     Pupils: Pupils are equal, round, and reactive to light.  Neck:     Thyroid: No thyroid mass or thyromegaly.  Cardiovascular:     Rate and Rhythm: Normal rate and regular rhythm.     Pulses: Normal pulses.          Radial pulses are 2+ on the right side and 2+ on the left side.     Heart sounds: Normal heart sounds. No murmur heard. Pulmonary:     Effort: Pulmonary effort is normal. No respiratory distress.     Breath sounds: Normal breath sounds. No wheezing, rhonchi or rales.  Abdominal:     General: Bowel sounds are normal. There is no distension.     Palpations: Abdomen is soft. There is no mass.     Tenderness: There is no abdominal tenderness. There is no guarding or rebound.     Hernia: No hernia is present.  Musculoskeletal:        General: Normal range of motion.     Cervical back: Normal range of motion and neck supple.     Right lower leg: No edema.     Left lower leg: No edema.  Lymphadenopathy:     Cervical: No cervical adenopathy.  Skin:    General: Skin is warm and dry.     Findings: No rash.     Comments: Few well circumscribed fatty lumps under skin R forearm and R anterior upper thigh - anticipate benign lipomas  Neurological:     General: No focal deficit present.     Mental Status: He is alert and oriented to person, place, and time.  Psychiatric:        Mood and Affect: Mood normal.        Behavior: Behavior normal.        Thought Content: Thought content normal.        Judgment: Judgment normal.      Results for orders placed or performed in visit on 06/23/21  TSH  Result Value Ref Range   TSH 1.37 0.35 - 5.50 uIU/mL  PSA  Result Value Ref Range   PSA 1.36 0.10 - 4.00 ng/mL  Comprehensive metabolic panel  Result Value Ref Range   Sodium 141  135 - 145 mEq/L   Potassium 4.8 3.5 - 5.1 mEq/L   Chloride 104 96 - 112 mEq/L   CO2 32 19 - 32 mEq/L   Glucose, Bld 93 70 - 99 mg/dL   BUN 13 6 - 23  mg/dL   Creatinine, Ser 0.92 0.40 - 1.50 mg/dL   Total Bilirubin 0.6 0.2 - 1.2 mg/dL   Alkaline Phosphatase 53 39 - 117 U/L   AST 26 0 - 37 U/L   ALT 50 0 - 53 U/L   Total Protein 6.7 6.0 - 8.3 g/dL   Albumin 4.5 3.5 - 5.2 g/dL   GFR 96.13 >60.00 mL/min   Calcium 9.5 8.4 - 10.5 mg/dL  Lipid panel  Result Value Ref Range   Cholesterol 186 0 - 200 mg/dL   Triglycerides 104.0 0.0 - 149.0 mg/dL   HDL 35.80 (L) >39.00 mg/dL   VLDL 20.8 0.0 - 40.0 mg/dL   LDL Cholesterol 129 (H) 0 - 99 mg/dL   Total CHOL/HDL Ratio 5    NonHDL 149.94     Assessment & Plan:  This visit occurred during the SARS-CoV-2 public health emergency.  Safety protocols were in place, including screening questions prior to the visit, additional usage of staff PPE, and extensive cleaning of exam room while observing appropriate contact time as indicated for disinfecting solutions.   Problem List Items Addressed This Visit     Healthcare maintenance - Primary (Chronic)    Preventative protocols reviewed and updated unless pt declined. Discussed healthy diet and lifestyle.       Dyslipidemia    Chronic, off medication. Encouraged diet choices to improve LDL cholesterol levels (increased fiber and legumes).  The 10-year ASCVD risk score (Arnett DK, et al., 2019) is: 5.3%   Values used to calculate the score:     Age: 23 years     Sex: Male     Is Non-Hispanic African American: No     Diabetic: No     Tobacco smoker: No     Systolic Blood Pressure: 295 mmHg     Is BP treated: No     HDL Cholesterol: 35.8 mg/dL     Total Cholesterol: 186 mg/dL       Obesity, Class I, BMI 30.0-34.9 (see actual BMI)    Encouraged healthy diet and lifestyle choices to affect sustainable weight loss.       Systolic murmur    Not appreciated      Tinnitus    Longstanding,  L>R.  H/o significant noise exposure.  Encouraged ear protection.  Check hearing screen today.         Meds ordered this encounter  Medications   triamcinolone cream (KENALOG) 0.1 %    Sig: APPLY  CREAM TOPICALLY TO AFFECTED AREA(S)  TWICE DAILY    Dispense:  45 g    Refill:  1   No orders of the defined types were placed in this encounter.    Patient instructions: You are doing well today Return as needed or in 1 year for next physical.  Hearing screen today.   Follow up plan: Return in about 1 year (around 06/28/2022) for annual exam, prior fasting for blood work.  Ria Bush, MD

## 2021-06-28 NOTE — Assessment & Plan Note (Signed)
Chronic, off medication. Encouraged diet choices to improve LDL cholesterol levels (increased fiber and legumes).  The 10-year ASCVD risk score (Arnett DK, et al., 2019) is: 5.3%   Values used to calculate the score:     Age: 51 years     Sex: Male     Is Non-Hispanic African American: No     Diabetic: No     Tobacco smoker: No     Systolic Blood Pressure: 887 mmHg     Is BP treated: No     HDL Cholesterol: 35.8 mg/dL     Total Cholesterol: 186 mg/dL

## 2021-06-30 ENCOUNTER — Encounter: Payer: BC Managed Care – PPO | Admitting: Family Medicine

## 2021-12-03 NOTE — Telephone Encounter (Signed)
error 

## 2021-12-30 DIAGNOSIS — H524 Presbyopia: Secondary | ICD-10-CM | POA: Diagnosis not present

## 2021-12-30 DIAGNOSIS — H25013 Cortical age-related cataract, bilateral: Secondary | ICD-10-CM | POA: Diagnosis not present

## 2021-12-30 DIAGNOSIS — D23111 Other benign neoplasm of skin of right upper eyelid, including canthus: Secondary | ICD-10-CM | POA: Diagnosis not present

## 2021-12-30 DIAGNOSIS — H2513 Age-related nuclear cataract, bilateral: Secondary | ICD-10-CM | POA: Diagnosis not present

## 2021-12-30 DIAGNOSIS — D3131 Benign neoplasm of right choroid: Secondary | ICD-10-CM | POA: Diagnosis not present

## 2022-01-21 ENCOUNTER — Other Ambulatory Visit: Payer: Self-pay

## 2022-01-21 DIAGNOSIS — L918 Other hypertrophic disorders of the skin: Secondary | ICD-10-CM | POA: Diagnosis not present

## 2022-01-21 DIAGNOSIS — D23111 Other benign neoplasm of skin of right upper eyelid, including canthus: Secondary | ICD-10-CM | POA: Diagnosis not present

## 2022-04-11 DIAGNOSIS — D225 Melanocytic nevi of trunk: Secondary | ICD-10-CM | POA: Diagnosis not present

## 2022-04-11 DIAGNOSIS — L570 Actinic keratosis: Secondary | ICD-10-CM | POA: Diagnosis not present

## 2022-04-11 DIAGNOSIS — L814 Other melanin hyperpigmentation: Secondary | ICD-10-CM | POA: Diagnosis not present

## 2022-04-11 DIAGNOSIS — L821 Other seborrheic keratosis: Secondary | ICD-10-CM | POA: Diagnosis not present

## 2022-06-18 ENCOUNTER — Other Ambulatory Visit: Payer: Self-pay | Admitting: Family Medicine

## 2022-06-18 DIAGNOSIS — E785 Hyperlipidemia, unspecified: Secondary | ICD-10-CM

## 2022-06-18 DIAGNOSIS — Z125 Encounter for screening for malignant neoplasm of prostate: Secondary | ICD-10-CM

## 2022-06-22 ENCOUNTER — Other Ambulatory Visit (INDEPENDENT_AMBULATORY_CARE_PROVIDER_SITE_OTHER): Payer: BC Managed Care – PPO

## 2022-06-22 DIAGNOSIS — Z125 Encounter for screening for malignant neoplasm of prostate: Secondary | ICD-10-CM

## 2022-06-22 DIAGNOSIS — E785 Hyperlipidemia, unspecified: Secondary | ICD-10-CM

## 2022-06-22 LAB — COMPREHENSIVE METABOLIC PANEL
ALT: 40 U/L (ref 0–53)
AST: 22 U/L (ref 0–37)
Albumin: 4.4 g/dL (ref 3.5–5.2)
Alkaline Phosphatase: 48 U/L (ref 39–117)
BUN: 15 mg/dL (ref 6–23)
CO2: 29 mEq/L (ref 19–32)
Calcium: 9.2 mg/dL (ref 8.4–10.5)
Chloride: 105 mEq/L (ref 96–112)
Creatinine, Ser: 0.96 mg/dL (ref 0.40–1.50)
GFR: 90.71 mL/min (ref >60.00)
Glucose, Bld: 94 mg/dL (ref 70–99)
Potassium: 4.8 mEq/L (ref 3.5–5.1)
Sodium: 141 mEq/L (ref 135–145)
Total Bilirubin: 0.5 mg/dL (ref 0.2–1.2)
Total Protein: 6.5 g/dL (ref 6.0–8.3)

## 2022-06-22 LAB — LIPID PANEL
Cholesterol: 183 mg/dL (ref 0–200)
HDL: 34.8 mg/dL — ABNORMAL LOW (ref >39.00)
LDL Cholesterol: 129 mg/dL — ABNORMAL HIGH (ref 0–99)
NonHDL: 148.11
Total CHOL/HDL Ratio: 5
Triglycerides: 95 mg/dL (ref 0.0–149.0)
VLDL: 19 mg/dL (ref 0.0–40.0)

## 2022-06-22 LAB — PSA: PSA: 1.81 ng/mL (ref 0.10–4.00)

## 2022-06-29 ENCOUNTER — Ambulatory Visit (INDEPENDENT_AMBULATORY_CARE_PROVIDER_SITE_OTHER): Payer: BC Managed Care – PPO | Admitting: Family Medicine

## 2022-06-29 ENCOUNTER — Encounter: Payer: Self-pay | Admitting: Family Medicine

## 2022-06-29 VITALS — BP 130/82 | HR 96 | Temp 97.4°F | Ht 67.5 in | Wt 202.0 lb

## 2022-06-29 DIAGNOSIS — E785 Hyperlipidemia, unspecified: Secondary | ICD-10-CM | POA: Diagnosis not present

## 2022-06-29 DIAGNOSIS — Z0000 Encounter for general adult medical examination without abnormal findings: Secondary | ICD-10-CM

## 2022-06-29 DIAGNOSIS — E669 Obesity, unspecified: Secondary | ICD-10-CM

## 2022-06-29 DIAGNOSIS — K603 Anal fistula: Secondary | ICD-10-CM

## 2022-06-29 DIAGNOSIS — B001 Herpesviral vesicular dermatitis: Secondary | ICD-10-CM | POA: Diagnosis not present

## 2022-06-29 DIAGNOSIS — H9313 Tinnitus, bilateral: Secondary | ICD-10-CM

## 2022-06-29 MED ORDER — FAMCICLOVIR 500 MG PO TABS
1500.0000 mg | ORAL_TABLET | Freq: Once | ORAL | 1 refills | Status: DC | PRN
Start: 2022-06-29 — End: 2022-08-30

## 2022-06-29 MED ORDER — FAMCICLOVIR 500 MG PO TABS: ORAL_TABLET | ORAL | 3 refills | Status: DC

## 2022-06-29 NOTE — Assessment & Plan Note (Signed)
Preventative protocols reviewed and updated unless pt declined. Discussed healthy diet and lifestyle.  

## 2022-06-29 NOTE — Assessment & Plan Note (Addendum)
No problem in the past 10 yrs, but noted recent discomfort to area - external exam reassuring today however h/o high transsphincteric fistula. To let me know if recurrent or ongoing symptoms for return to [facility] - previously saw Dr Lenise Arena.

## 2022-06-29 NOTE — Patient Instructions (Addendum)
Continue working on exercise routine.  You are doing well today Return as needed or in 1 year for next physical  Let us know if any recurrent fistula symptoms for return to [facility].

## 2022-06-29 NOTE — Assessment & Plan Note (Signed)
Chronic off medication. Encourage low chol diet. The 10-year ASCVD risk score (Arnett DK, et al., 2019) is: 5.5%   Values used to calculate the score:     Age: 52 years     Sex: Male     Is Non-Hispanic African American: No     Diabetic: No     Tobacco smoker: No     Systolic Blood Pressure: 161 mmHg     Is BP treated: No     HDL Cholesterol: 34.8 mg/dL     Total Cholesterol: 183 mg/dL

## 2022-06-29 NOTE — Assessment & Plan Note (Addendum)
Notes ongoing difficulty, longstanding L>R in h/o noise exposure Hearing screen normal today. Reviewed hearing protection going forward.  He requests further eval - will refer to ENT for eval.

## 2022-06-29 NOTE — Assessment & Plan Note (Signed)
Continue to encourage healthy diet and lifestyle choices to affect sustainable weight loss.  °

## 2022-06-29 NOTE — Progress Notes (Signed)
Patient ID: Wayne Smith, male    DOB: May 28, 1970, 52 y.o.   MRN: [id number]  This visit was conducted in person.  BP 130/82   Pulse 96   Temp (!) 97.4 F (36.3 C) (Temporal)   Ht 5' 7.5" (1.715 m)   Wt 202 lb (91.6 kg)   SpO2 97%   BMI 31.17 kg/m   Hearing Screening   '500Hz'$  '1000Hz'$  '2000Hz'$  '4000Hz'$   Right ear 20 20 40 40  Left ear 20 25 40 40     CC: CPE Subjective:   HPI: Wayne Smith is a 52 y.o. male presenting on 06/29/2022 for Annual Exam   Episode 1 month ago of lower back pain that lasted several weeks. No radiculopathy or numbness/weakness.   2 wks ago noted inflammation of perirectal fistula. No drainage or fever. This has since stopped bothering him.   Notes ongoing L>R tinnitus for years, as well as noted decreased hearing on left when previously checked. Has had loud noise exposure.   Preventative: Colonoscopy 07/2020 - TAx3, HP, rpt 7 yrs Product manager)  Prostate cancer - father with this at age 52yo s/p prostate surgery - will start at age 23. No nocturia or weakening of stream.  Lung cancer screening - not eligible  Flu shot yearly COVID vaccine - discussed, declines Td 2007, Tdap 2020  Shingrix 05/2020, 08/2020 Seat belt use discussed Sunscreen use discussed. No changing moles on skin. Sees derm yearly.  Sleep - averaging 7-8 hours/night, notes some sleep maintenance insomnia  Non smoker. Wife smokes outside.  Alcohol - occasional on weekends Dentist - q6 mo  Eye exam yearly   Caffeine: 1 cup coffee, sweet tea throughout the day  Lives with wife, dog and cats  Occupation: Development worker, [employer] - desk job Activity: walking dog daily, playing basketball - has bike and kayak Diet: some water, fruits/vegetables daily, red meat once a week      Relevant past medical, surgical, family and social history reviewed and updated as indicated. Interim medical history since our last visit reviewed. Allergies and medications reviewed and updated. Outpatient Medications  Prior to Visit  Medication Sig Dispense Refill   Ascorbic Acid (VITAMIN C) 1000 MG tablet Take 1,000 mg by mouth daily.     cetirizine (ZYRTEC) 10 MG tablet Take 10 mg by mouth daily.     Multiple Vitamin (MULTIVITAMIN) tablet Take 1 tablet by mouth daily.     triamcinolone cream (KENALOG) 0.1 % APPLY  CREAM TOPICALLY TO AFFECTED AREA(S)  TWICE DAILY 45 g 1   famciclovir (FAMVIR) 500 MG tablet TAKE 1 TABLET BY MOUTH THREE TIMES DAILY AS NEEDED AS DIRECTED 21 tablet 3   No facility-administered medications prior to visit.     Per HPI unless specifically indicated in ROS section below Review of Systems  Constitutional:  Negative for activity change, appetite change, chills, fatigue, fever and unexpected weight change.  HENT:  Negative for hearing loss.   Eyes:  Negative for visual disturbance.  Respiratory:  Negative for cough, chest tightness, shortness of breath and wheezing.   Cardiovascular:  Negative for chest pain, palpitations and leg swelling.  Gastrointestinal:  Negative for abdominal distention, abdominal pain, blood in stool, constipation, diarrhea, nausea and vomiting.  Genitourinary:  Negative for difficulty urinating and hematuria.  Musculoskeletal:  Positive for back pain. Negative for arthralgias, myalgias and neck pain.  Skin:  Negative for rash.  Neurological:  Negative for dizziness, seizures, syncope and headaches.  Hematological:  Negative  for adenopathy. Does not bruise/bleed easily.  Psychiatric/Behavioral:  Negative for dysphoric mood. The patient is not nervous/anxious.     Objective:  BP 130/82   Pulse 96   Temp (!) 97.4 F (36.3 C) (Temporal)   Ht 5' 7.5" (1.715 m)   Wt 202 lb (91.6 kg)   SpO2 97%   BMI 31.17 kg/m   Wt Readings from Last 3 Encounters:  06/29/22 202 lb (91.6 kg)  06/28/21 203 lb 7 oz (92.3 kg)  08/07/20 196 lb (88.9 kg)      Physical Exam Vitals and nursing note reviewed.  Constitutional:      General: He is not in acute  distress.    Appearance: Normal appearance. He is well-developed. He is not ill-appearing.  HENT:     Head: Normocephalic and atraumatic.     Right Ear: Hearing, tympanic membrane, ear canal and external ear normal.     Left Ear: Hearing, tympanic membrane, ear canal and external ear normal.     Nose: Nose normal.     Mouth/Throat:     Mouth: Mucous membranes are moist.     Pharynx: Oropharynx is clear. No oropharyngeal exudate or posterior oropharyngeal erythema.  Eyes:     General: No scleral icterus.    Extraocular Movements: Extraocular movements intact.     Conjunctiva/sclera: Conjunctivae normal.     Pupils: Pupils are equal, round, and reactive to light.  Neck:     Thyroid: No thyroid mass or thyromegaly.  Cardiovascular:     Rate and Rhythm: Normal rate and regular rhythm.     Pulses: Normal pulses.          Radial pulses are 2+ on the right side and 2+ on the left side.     Heart sounds: Normal heart sounds. No murmur heard. Pulmonary:     Effort: Pulmonary effort is normal. No respiratory distress.     Breath sounds: Normal breath sounds. No wheezing, rhonchi or rales.  Abdominal:     General: Bowel sounds are normal. There is no distension.     Palpations: Abdomen is soft. There is no mass.     Tenderness: There is no abdominal tenderness. There is no guarding or rebound.     Hernia: No hernia is present.  Genitourinary:    Rectum: No mass, anal fissure or external hemorrhoid.     Comments: External exam only performed - no obvious fistula, swelling noted Musculoskeletal:        General: Normal range of motion.     Cervical back: Normal range of motion and neck supple.     Right lower leg: No edema.     Left lower leg: No edema.  Lymphadenopathy:     Cervical: No cervical adenopathy.  Skin:    General: Skin is warm and dry.     Findings: No rash.  Neurological:     General: No focal deficit present.     Mental Status: He is alert and oriented to person, place,  and time.  Psychiatric:        Mood and Affect: Mood normal.        Behavior: Behavior normal.        Thought Content: Thought content normal.        Judgment: Judgment normal.       Results for orders placed or performed in visit on 06/22/22  PSA  Result Value Ref Range   PSA 1.81 0.10 - 4.00 ng/mL  Comprehensive metabolic panel  Result Value Ref Range   Sodium 141 135 - 145 mEq/L   Potassium 4.8 3.5 - 5.1 mEq/L   Chloride 105 96 - 112 mEq/L   CO2 29 19 - 32 mEq/L   Glucose, Bld 94 70 - 99 mg/dL   BUN 15 6 - 23 mg/dL   Creatinine, Ser 0.96 0.40 - 1.50 mg/dL   Total Bilirubin 0.5 0.2 - 1.2 mg/dL   Alkaline Phosphatase 48 39 - 117 U/L   AST 22 0 - 37 U/L   ALT 40 0 - 53 U/L   Total Protein 6.5 6.0 - 8.3 g/dL   Albumin 4.4 3.5 - 5.2 g/dL   GFR 90.71 >60.00 mL/min   Calcium 9.2 8.4 - 10.5 mg/dL  Lipid panel  Result Value Ref Range   Cholesterol 183 0 - 200 mg/dL   Triglycerides 95.0 0.0 - 149.0 mg/dL   HDL 34.80 (L) >39.00 mg/dL   VLDL 19.0 0.0 - 40.0 mg/dL   LDL Cholesterol 129 (H) 0 - 99 mg/dL   Total CHOL/HDL Ratio 5    NonHDL 148.11     Assessment & Plan:   Problem List Items Addressed This Visit     Healthcare maintenance - Primary (Chronic)    Preventative protocols reviewed and updated unless pt declined. Discussed healthy diet and lifestyle.       Herpes labialis    No recent fever blisters - will update famcyclovir Rx      Relevant Medications   famciclovir (FAMVIR) 500 MG tablet   Perianal fistula    No problem in the past 10 yrs, but noted recent discomfort to area - external exam reassuring today however h/o high transsphincteric fistula. To let me know if recurrent or ongoing symptoms for return to [facility] - previously saw Dr Lenise Arena.       Dyslipidemia    Chronic off medication. Encourage low chol diet. The 10-year ASCVD risk score (Arnett DK, et al., 2019) is: 5.5%   Values used to calculate the score:     Age: 22 years      Sex: Male     Is Non-Hispanic African American: No     Diabetic: No     Tobacco smoker: No     Systolic Blood Pressure: 696 mmHg     Is BP treated: No     HDL Cholesterol: 34.8 mg/dL     Total Cholesterol: 183 mg/dL       Obesity, Class I, BMI 30.0-34.9 (see actual BMI)    Continue to encourage healthy diet and lifestyle choices to affect sustainable weight loss.       Tinnitus    Notes ongoing difficulty, longstanding L>R in h/o noise exposure Hearing screen normal today. Reviewed hearing protection going forward.  He requests further eval - will refer to ENT for eval.       Relevant Orders   Ambulatory referral to ENT     Meds ordered this encounter  Medications   DISCONTD: famciclovir (FAMVIR) 500 MG tablet    Sig: TAKE 1 TABLET BY MOUTH THREE TIMES DAILY AS NEEDED AS DIRECTED    Dispense:  21 tablet    Refill:  3   famciclovir (FAMVIR) 500 MG tablet    Sig: Take 3 tablets (1,500 mg total) by mouth Once PRN (per episode of cold sore/fever blister).    Dispense:  9 tablet    Refill:  1    Orders Placed This Encounter  Procedures  Ambulatory referral to ENT    Referral Priority:   Routine    Referral Type:   Consultation    Referral Reason:   Specialty Services Required    Requested Specialty:   Otolaryngology    Number of Visits Requested:   1    Patient Instructions  Continue working on exercise routine.  You are doing well today Return as needed or in 1 year for next physical  Let us know if any recurrent fistula symptoms for return to [facility].   Follow up plan: Return in about 1 year (around 06/30/2023), or if symptoms worsen or fail to improve, for annual exam, prior fasting for blood work.  Ria Bush, MD

## 2022-06-29 NOTE — Assessment & Plan Note (Signed)
No recent fever blisters - will update famcyclovir Rx

## 2022-07-05 ENCOUNTER — Encounter: Payer: Self-pay | Admitting: *Deleted

## 2022-08-30 ENCOUNTER — Other Ambulatory Visit: Payer: Self-pay | Admitting: Family Medicine

## 2022-08-30 MED ORDER — FAMCICLOVIR 500 MG PO TABS: 1500.0000 mg | ORAL_TABLET | Freq: Once | ORAL | 1 refills | Status: DC | PRN

## 2022-08-30 NOTE — Telephone Encounter (Signed)
ERx 

## 2022-08-30 NOTE — Telephone Encounter (Signed)
Last refill 06/29/22 #9/1 Last office visit 06/29/22 Patient request #[id number]

## 2022-08-30 NOTE — Telephone Encounter (Signed)
Prescription Request  08/30/2022  LOV: 06/29/2022  What is the name of the medication or equipment? famciclovir (FAMVIR) 500 MG tablet   Have you contacted your pharmacy to request a refill? No   Which pharmacy would you like this sent to?  [facility] [address], [place] - [address] [place] [address] Phone: [phone] Fax: [phone]    Patient notified that their request is being sent to the clinical staff for review and that they should receive a response within 2 business days.   Please advise at [phone]  Patient has misplaced the last refill they received. This is meant to be a replacement. Also requesting to have 21 tablet instead of 9 tablet sent in.

## 2022-09-14 DIAGNOSIS — H903 Sensorineural hearing loss, bilateral: Secondary | ICD-10-CM | POA: Diagnosis not present

## 2022-09-14 DIAGNOSIS — H9313 Tinnitus, bilateral: Secondary | ICD-10-CM | POA: Diagnosis not present

## 2022-10-26 DIAGNOSIS — L578 Other skin changes due to chronic exposure to nonionizing radiation: Secondary | ICD-10-CM | POA: Diagnosis not present

## 2022-10-26 DIAGNOSIS — L570 Actinic keratosis: Secondary | ICD-10-CM | POA: Diagnosis not present

## 2023-04-27 DIAGNOSIS — L814 Other melanin hyperpigmentation: Secondary | ICD-10-CM | POA: Diagnosis not present

## 2023-04-27 DIAGNOSIS — D225 Melanocytic nevi of trunk: Secondary | ICD-10-CM | POA: Diagnosis not present

## 2023-04-27 DIAGNOSIS — L821 Other seborrheic keratosis: Secondary | ICD-10-CM | POA: Diagnosis not present

## 2023-06-13 ENCOUNTER — Other Ambulatory Visit: Payer: Self-pay | Admitting: Family Medicine

## 2023-06-22 ENCOUNTER — Other Ambulatory Visit: Payer: Self-pay | Admitting: Family Medicine

## 2023-06-22 DIAGNOSIS — E785 Hyperlipidemia, unspecified: Secondary | ICD-10-CM

## 2023-06-22 DIAGNOSIS — Z125 Encounter for screening for malignant neoplasm of prostate: Secondary | ICD-10-CM

## 2023-06-26 ENCOUNTER — Other Ambulatory Visit (INDEPENDENT_AMBULATORY_CARE_PROVIDER_SITE_OTHER): Payer: BC Managed Care – PPO

## 2023-06-26 DIAGNOSIS — Z125 Encounter for screening for malignant neoplasm of prostate: Secondary | ICD-10-CM | POA: Diagnosis not present

## 2023-06-26 DIAGNOSIS — E785 Hyperlipidemia, unspecified: Secondary | ICD-10-CM | POA: Diagnosis not present

## 2023-06-26 LAB — COMPREHENSIVE METABOLIC PANEL
ALT: 41 U/L (ref 0–53)
AST: 25 U/L (ref 0–37)
Albumin: 4.5 g/dL (ref 3.5–5.2)
Alkaline Phosphatase: 55 U/L (ref 39–117)
BUN: 15 mg/dL (ref 6–23)
CO2: 27 meq/L (ref 19–32)
Calcium: 9.4 mg/dL (ref 8.4–10.5)
Chloride: 106 meq/L (ref 96–112)
Creatinine, Ser: 0.92 mg/dL (ref 0.40–1.50)
GFR: 94.79 mL/min (ref >60.00)
Glucose, Bld: 92 mg/dL (ref 70–99)
Potassium: 4.9 meq/L (ref 3.5–5.1)
Sodium: 141 meq/L (ref 135–145)
Total Bilirubin: 0.6 mg/dL (ref 0.2–1.2)
Total Protein: 7 g/dL (ref 6.0–8.3)

## 2023-06-26 LAB — LIPID PANEL
Cholesterol: 181 mg/dL (ref 0–200)
HDL: 40.4 mg/dL (ref >39.00)
LDL Cholesterol: 123 mg/dL — ABNORMAL HIGH (ref 0–99)
NonHDL: 141.04
Total CHOL/HDL Ratio: 4
Triglycerides: 92 mg/dL (ref 0.0–149.0)
VLDL: 18.4 mg/dL (ref 0.0–40.0)

## 2023-06-26 LAB — PSA: PSA: 2.43 ng/mL (ref 0.10–4.00)

## 2023-07-03 ENCOUNTER — Encounter: Payer: Self-pay | Admitting: Family Medicine

## 2023-07-03 ENCOUNTER — Ambulatory Visit (INDEPENDENT_AMBULATORY_CARE_PROVIDER_SITE_OTHER): Payer: BC Managed Care – PPO | Admitting: Family Medicine

## 2023-07-03 VITALS — BP 138/88 | HR 92 | Temp 98.5°F | Ht 67.5 in | Wt 200.4 lb

## 2023-07-03 DIAGNOSIS — H9313 Tinnitus, bilateral: Secondary | ICD-10-CM

## 2023-07-03 DIAGNOSIS — E785 Hyperlipidemia, unspecified: Secondary | ICD-10-CM

## 2023-07-03 DIAGNOSIS — E66811 Obesity, class 1: Secondary | ICD-10-CM

## 2023-07-03 DIAGNOSIS — Z0000 Encounter for general adult medical examination without abnormal findings: Secondary | ICD-10-CM

## 2023-07-03 DIAGNOSIS — H903 Sensorineural hearing loss, bilateral: Secondary | ICD-10-CM | POA: Insufficient documentation

## 2023-07-03 MED ORDER — MELATONIN 10 MG PO CHEW: 10.0000 mg | CHEWABLE_TABLET | Freq: Every day | ORAL | Status: AC

## 2023-07-03 MED ORDER — THEANINE 200 MG PO CAPS: 1.0000 | ORAL_CAPSULE | Freq: Every evening | ORAL | Status: AC | PRN

## 2023-07-03 NOTE — Assessment & Plan Note (Signed)
 Preventative protocols reviewed and updated unless pt declined. Discussed healthy diet and lifestyle.

## 2023-07-03 NOTE — Progress Notes (Signed)
 Ph: [phone] Fax: [phone]   Patient ID: Wayne Smith, male    DOB: Apr 03, 1971, 54 y.o.   MRN: [id number]  This visit was conducted in person.  BP 138/88   Pulse 92   Temp 98.5 F (36.9 C) (Oral)   Ht 5' 7.5" (1.715 m)   Wt 200 lb 6 oz (90.9 kg)   SpO2 97%   BMI 30.92 kg/m    CC: CPE Subjective:   HPI: Wayne Smith is a 53 y.o. male presenting on 07/03/2023 for Annual Exam   Stressful at work - they are undergoing layoffs.   Saw ENT 08/2022 for chronic tinnitus with mild-mod NSHL. Rec hearing screen q1-2 yrs. To consider hearing aides if desired.   Ongoing sleep maintenance insomnia. Averaging 7-8 hours/night. Bedtime 8-9 pm, wakes up at 4am, but can wake up early every night at 1am with trouble falling back asleep. No alcohol, limits caffeine after 7pm. Takes melatonin 10mg  at night.   Preventative: Colonoscopy 07/2020 - TAx3, HP, rpt 7 yrs ([facility])  Prostate cancer - yearly PSA. Father with h/o prostate cancer at age 62yo s/p surgery. Denies nocturia or weakening of stream.  Lung cancer screening - not eligible  Flu shot yearly COVID vaccine - discussed, declines Td 2007, Tdap 2020  Shingrix  05/2020, 08/2020 Seat belt use discussed Sunscreen use discussed. No changing moles on skin. Sees derm yearly Martina Sledge).  Sleep - averaging 7-8 hours/night Non smoker. Wife smokes outside.  Alcohol - occasional on weekends.  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 Interim medical history since our last visit reviewed. Allergies and medications reviewed and updated. Outpatient  Medications Prior to Visit  Medication Sig Dispense Refill   Ascorbic Acid (VITAMIN C) 1000 MG tablet Take 1,000 mg by mouth daily.     cetirizine (ZYRTEC) 10 MG tablet Take 10 mg by mouth daily.     famciclovir  (FAMVIR ) 500 MG tablet Take 3 tablets (1,500 mg total) by mouth Once PRN (per episode of cold sore/fever blister). 21 tablet 1   Multiple Vitamin (MULTIVITAMIN) tablet Take 1 tablet by mouth daily.     triamcinolone  cream (KENALOG ) 0.1 % APPLY  CREAM EXTERNALLY TWICE DAILY 45 g 0   No facility-administered medications prior to visit.     Per HPI unless specifically indicated in ROS section below Review of Systems  Constitutional:  Negative for activity change, appetite change, chills, fatigue, fever and unexpected weight change.  HENT:  Negative for hearing loss.   Eyes:  Negative for visual disturbance.  Respiratory:  Negative for cough, chest tightness, shortness of breath and wheezing.   Cardiovascular:  Negative for chest pain, palpitations and leg swelling.  Gastrointestinal:  Negative for abdominal distention, abdominal pain, blood in stool, constipation, diarrhea, nausea and vomiting.  Genitourinary:  Negative for difficulty urinating and hematuria.  Musculoskeletal:  Negative for arthralgias, myalgias and neck pain.  Skin:  Negative for rash.  Neurological:  Negative for dizziness, seizures, syncope and headaches.  Hematological:  Negative for adenopathy. Does not bruise/bleed easily.  Psychiatric/Behavioral:  Negative  for dysphoric mood. The patient is not nervous/anxious.     Objective:  BP 138/88   Pulse 92   Temp 98.5 F (36.9 C) (Oral)   Ht 5' 7.5" (1.715 m)   Wt 200 lb 6 oz (90.9 kg)   SpO2 97%   BMI 30.92 kg/m   Wt Readings from Last 3 Encounters:  07/03/23 200 lb 6 oz (90.9 kg)  06/29/22 202 lb (91.6 kg)  06/28/21 203 lb 7 oz (92.3 kg)      Physical Exam Vitals and nursing note reviewed.  Constitutional:      General: He is not in acute  distress.    Appearance: Normal appearance. He is well-developed. He is not ill-appearing.  HENT:     Head: Normocephalic and atraumatic.     Right Ear: Hearing, tympanic membrane, ear canal and external ear normal.     Left Ear: Hearing, tympanic membrane, ear canal and external ear normal.     Mouth/Throat:     Mouth: Mucous membranes are moist.     Pharynx: Oropharynx is clear. No oropharyngeal exudate or posterior oropharyngeal erythema.  Eyes:     General: No scleral icterus.    Extraocular Movements: Extraocular movements intact.     Conjunctiva/sclera: Conjunctivae normal.     Pupils: Pupils are equal, round, and reactive to light.  Neck:     Thyroid : No thyroid  mass or thyromegaly.  Cardiovascular:     Rate and Rhythm: Normal rate and regular rhythm.     Pulses: Normal pulses.          Radial pulses are 2+ on the right side and 2+ on the left side.     Heart sounds: Normal heart sounds. No murmur heard. Pulmonary:     Effort: Pulmonary effort is normal. No respiratory distress.     Breath sounds: Normal breath sounds. No wheezing, rhonchi or rales.  Abdominal:     General: Bowel sounds are normal. There is no distension.     Palpations: Abdomen is soft. There is no mass.     Tenderness: There is no abdominal tenderness. There is no guarding or rebound.     Hernia: No hernia is present.  Musculoskeletal:        General: Normal range of motion.     Cervical back: Normal range of motion and neck supple.     Right lower leg: No edema.     Left lower leg: No edema.  Lymphadenopathy:     Cervical: No cervical adenopathy.  Skin:    General: Skin is warm and dry.     Findings: No rash.  Neurological:     General: No focal deficit present.     Mental Status: He is alert and oriented to person, place, and time.  Psychiatric:        Mood and Affect: Mood normal.        Behavior: Behavior normal.        Thought Content: Thought content normal.        Judgment: Judgment  normal.       Results for orders placed or performed in visit on 06/26/23  PSA   Collection Time: 06/26/23  7:28 AM  Result Value Ref Range   PSA 2.43 0.10 - 4.00 ng/mL  Comprehensive metabolic panel   Collection Time: 06/26/23  7:28 AM  Result Value Ref Range   Sodium 141 135 - 145 mEq/L   Potassium 4.9 3.5 - 5.1 mEq/L   Chloride 106  96 - 112 mEq/L   CO2 27 19 - 32 mEq/L   Glucose, Bld 92 70 - 99 mg/dL   BUN 15 6 - 23 mg/dL   Creatinine, Ser 8.11 0.40 - 1.50 mg/dL   Total Bilirubin 0.6 0.2 - 1.2 mg/dL   Alkaline Phosphatase 55 39 - 117 U/L   AST 25 0 - 37 U/L   ALT 41 0 - 53 U/L   Total Protein 7.0 6.0 - 8.3 g/dL   Albumin 4.5 3.5 - 5.2 g/dL   GFR 91.47 >82.95 mL/min   Calcium  9.4 8.4 - 10.5 mg/dL  Lipid panel   Collection Time: 06/26/23  7:28 AM  Result Value Ref Range   Cholesterol 181 0 - 200 mg/dL   Triglycerides 62.1 0.0 - 149.0 mg/dL   HDL 30.86 >57.84 mg/dL   VLDL 69.6 0.0 - 29.5 mg/dL   LDL Cholesterol 284 (H) 0 - 99 mg/dL   Total CHOL/HDL Ratio 4    NonHDL 141.04     Assessment & Plan:   Problem List Items Addressed This Visit     Healthcare maintenance - Primary (Chronic)   Preventative protocols reviewed and updated unless pt declined. Discussed healthy diet and lifestyle.       Dyslipidemia   Chronic off statin. Reviewed diet choices to improve cholesterol levels.  The 10-year ASCVD risk score (Arnett DK, et al., 2019) is: 5.7%   Values used to calculate the score:     Age: 88 years     Sex: Male     Is Non-Hispanic African American: No     Diabetic: No     Tobacco smoker: No     Systolic Blood Pressure: 138 mmHg     Is BP treated: No     HDL Cholesterol: 40.4 mg/dL     Total Cholesterol: 181 mg/dL       Obesity, Class I, BMI 30.0-34.9 (see actual BMI)   Continue to encourage healthy diet and lifestyle choices to affect sustainable weight loss.       Tinnitus   Sensorineural hearing loss (SNHL) of both ears   S/p ENT eval last year due  to tinnitus.  To consider hearing aides.         Meds ordered this encounter  Medications   Melatonin 10 MG CHEW    Sig: Chew 10 mg by mouth at bedtime.   Theanine 200 MG CAPS    Sig: Take 1 capsule by mouth at bedtime as needed (night time awakenings).    No orders of the defined types were placed in this encounter.   Patient Instructions  Consider L-theanine supplement 200mg  as needed for night time awakenings.  Work on weight loss through healthy diet and regular exercise routine.  You are doing well today Return as needed or in 1 year for next physical  Follow up plan: Return in about 1 year (around 07/02/2024), or if symptoms worsen or fail to improve, for annual exam, prior fasting for blood work.  Claire Crick, MD

## 2023-07-03 NOTE — Assessment & Plan Note (Signed)
 S/p ENT eval last year due to tinnitus.  To consider hearing aides.

## 2023-07-03 NOTE — Assessment & Plan Note (Signed)
 Continue to encourage healthy diet and lifestyle choices to affect sustainable weight loss.

## 2023-07-03 NOTE — Patient Instructions (Addendum)
 Consider L-theanine supplement 200mg  as needed for night time awakenings.  Work on weight loss through healthy diet and regular exercise routine.  You are doing well today Return as needed or in 1 year for next physical

## 2023-07-03 NOTE — Assessment & Plan Note (Addendum)
 Chronic off statin. Reviewed diet choices to improve cholesterol levels.  The 10-year ASCVD risk score (Arnett DK, et al., 2019) is: 5.7%   Values used to calculate the score:     Age: 53 years     Sex: Male     Is Non-Hispanic African American: No     Diabetic: No     Tobacco smoker: No     Systolic Blood Pressure: 138 mmHg     Is BP treated: No     HDL Cholesterol: 40.4 mg/dL     Total Cholesterol: 181 mg/dL

## 2023-09-05 ENCOUNTER — Encounter: Payer: Self-pay | Admitting: Family Medicine

## 2023-09-06 NOTE — Telephone Encounter (Signed)
 Mychart message sent. Asked pt to call office to schedule appointment with another provider if he wishes to have sooner appointment.

## 2023-09-11 ENCOUNTER — Other Ambulatory Visit: Payer: Self-pay | Admitting: Family Medicine

## 2023-09-11 DIAGNOSIS — B001 Herpesviral vesicular dermatitis: Secondary | ICD-10-CM

## 2023-09-12 ENCOUNTER — Other Ambulatory Visit: Payer: Self-pay | Admitting: Family Medicine

## 2023-09-12 NOTE — Telephone Encounter (Signed)
 Copied from CRM [id number]. Topic: Clinical - Prescription Issue >> Sep 12, 2023  4:44 PM [employer] wrote: Reason for CRM: Patient is out of famciclovir  (FAMVIR ) 500 MG tablet and says he needs it. I informed him that it may take up to 3 business days for refills and provided him with the current status.  Callback #: [phone]

## 2023-09-13 MED ORDER — FAMCICLOVIR 500 MG PO TABS: 1500.0000 mg | ORAL_TABLET | Freq: Once | ORAL | 1 refills | Status: AC | PRN

## 2023-09-13 NOTE — Telephone Encounter (Signed)
 ERx

## 2023-09-13 NOTE — Telephone Encounter (Signed)
 Famciclovir  Last filled:  08/30/22, #21 Last OV:  07/03/23, CPE Next OV:  09/19/23, acute abscess on buttock

## 2023-09-19 ENCOUNTER — Encounter: Payer: Self-pay | Admitting: Family Medicine

## 2023-09-19 ENCOUNTER — Ambulatory Visit: Admitting: Family Medicine

## 2023-09-19 VITALS — BP 134/80 | HR 93 | Temp 98.1°F | Ht 67.5 in | Wt 202.0 lb

## 2023-09-19 DIAGNOSIS — K603 Anal fistula, unspecified: Secondary | ICD-10-CM

## 2023-09-19 MED ORDER — DOXYCYCLINE HYCLATE 100 MG PO TABS: 100.0000 mg | ORAL_TABLET | Freq: Two times a day (BID) | ORAL | 1 refills | Status: AC

## 2023-09-19 NOTE — Assessment & Plan Note (Signed)
 Concern for recurrence after prolonged quiescent period.  Rx doxycycline  to have on hand if acute exacerbation/increased drainage.  Refer back to gen surgery for re evaluation - Dr Dorris Gaul at [facility].

## 2023-09-19 NOTE — Progress Notes (Signed)
 Ph: [phone] Fax: [phone]   Patient ID: Wayne Smith, male    DOB: [birth year], 53 y.o.   MRN: [id number]  This visit was conducted in person.  BP 134/80   Pulse 93   Temp 98.1 F (36.7 C) (Oral)   Ht 5' 7.5" (1.715 m)   Wt 202 lb (91.6 kg)   SpO2 97%   BMI 31.17 kg/m    CC: recurrent abscess to L buttock Subjective:   HPI: Wayne Smith is a 53 y.o. male presenting on 09/19/2023 for Abscess (C/o abscess on L buttock. Started about 3 mos ago- on and off. H/o abscess for yrs.)   Chronic recurrent issue over the past 2 yrs.  Latest episode started several months ago - intermittently gets inflamed, flared up, with small amt of drainage. L side issue.   No fevers/chills, nausea/vomiting, back pain or shooting pain down legs.  Treating with advil  PRN and topical neosporin.   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 Gen Surg Dr Dorris Gaul). Last evaluated 2013.   Ongoing work stressors.      Relevant past medical, surgical, family and social history reviewed and updated as indicated. Interim medical history since our last visit reviewed. Allergies and medications reviewed and updated. Outpatient Medications Prior to Visit  Medication Sig Dispense Refill   Ascorbic Acid (VITAMIN C) 1000 MG tablet Take 1,000 mg by mouth daily.     cetirizine (ZYRTEC) 10 MG tablet Take 10 mg by mouth daily.     famciclovir  (FAMVIR ) 500 MG tablet Take 3 tablets (1,500 mg total) by mouth Once PRN (per episode of cold sore/fever blister). 21 tablet 1   Melatonin 10 MG CHEW Chew 10 mg by mouth at bedtime.     Multiple Vitamin (MULTIVITAMIN) tablet Take 1 tablet by mouth daily.     Theanine 200 MG CAPS Take 1 capsule by mouth at bedtime as needed (night time awakenings).     triamcinolone  cream (KENALOG ) 0.1 % APPLY  CREAM EXTERNALLY TWICE DAILY 45 g 0   No facility-administered medications prior to visit.     Per HPI  unless specifically indicated in ROS section below Review of Systems  Objective:  BP 134/80   Pulse 93   Temp 98.1 F (36.7 C) (Oral)   Ht 5' 7.5" (1.715 m)   Wt 202 lb (91.6 kg)   SpO2 97%   BMI 31.17 kg/m   Wt Readings from Last 3 Encounters:  09/19/23 202 lb (91.6 kg)  07/03/23 200 lb 6 oz (90.9 kg)  06/29/22 202 lb (91.6 kg)      Physical Exam Vitals and nursing note reviewed.  Constitutional:      Appearance: Normal appearance. He is not ill-appearing.  Genitourinary:      Comments: Small pustule left perineal region anterior to anus without active or reproducible drainage, or significant erythema/induration/fluctuance present Skin:    General: Skin is warm and dry.  Neurological:     Mental Status: He is alert.  Psychiatric:        Mood and Affect: Mood normal.        Behavior: Behavior normal.        Assessment & Plan:   Problem List Items Addressed This Visit     Perianal fistula - Primary   Concern for recurrence after prolonged quiescent period.  Rx doxycycline  to have on hand if acute exacerbation/increased drainage.  Refer back to gen surgery for  re evaluation - Dr Dorris Gaul at [facility].       Relevant Orders   Ambulatory referral to General Surgery     Meds ordered this encounter  Medications   doxycycline  (VIBRA -TABS) 100 MG tablet    Sig: Take 1 tablet (100 mg total) by mouth 2 (two) times daily.    Dispense:  20 tablet    Refill:  1    Orders Placed This Encounter  Procedures   Ambulatory referral to General Surgery    Referral Priority:   Routine    Referral Type:   Surgical    Referral Reason:   Specialty Services Required    Requested Specialty:   General Surgery    Number of Visits Requested:   1    Patient Instructions  We will refer you back to Dr Dorris Gaul general surgery with [facility] in [facility] general surgeon.  May take doxycycline  antibiotic as needed for flare.   Follow up plan: No follow-ups on  file.  Claire Crick, MD

## 2023-09-19 NOTE — Patient Instructions (Addendum)
 We will refer you back to Dr Dorris Gaul general surgery with [facility] in [facility] general surgeon.  May take doxycycline  antibiotic as needed for flare.

## 2023-10-10 DIAGNOSIS — K603 Anal fistula, unspecified: Secondary | ICD-10-CM | POA: Diagnosis not present

## 2023-12-01 DIAGNOSIS — K603 Anal fistula, unspecified: Secondary | ICD-10-CM | POA: Diagnosis not present

## 2023-12-11 DIAGNOSIS — Z9889 Other specified postprocedural states: Secondary | ICD-10-CM | POA: Diagnosis not present

## 2023-12-21 DIAGNOSIS — K603 Anal fistula, unspecified: Secondary | ICD-10-CM | POA: Diagnosis not present

## 2024-01-02 DIAGNOSIS — D3131 Benign neoplasm of right choroid: Secondary | ICD-10-CM | POA: Diagnosis not present

## 2024-01-02 DIAGNOSIS — H25813 Combined forms of age-related cataract, bilateral: Secondary | ICD-10-CM | POA: Diagnosis not present

## 2024-01-02 DIAGNOSIS — H35013 Changes in retinal vascular appearance, bilateral: Secondary | ICD-10-CM | POA: Diagnosis not present

## 2024-01-02 DIAGNOSIS — H35033 Hypertensive retinopathy, bilateral: Secondary | ICD-10-CM | POA: Diagnosis not present

## 2024-03-26 ENCOUNTER — Other Ambulatory Visit: Payer: Self-pay | Admitting: Family Medicine

## 2024-04-30 DIAGNOSIS — D225 Melanocytic nevi of trunk: Secondary | ICD-10-CM | POA: Diagnosis not present

## 2024-04-30 DIAGNOSIS — L821 Other seborrheic keratosis: Secondary | ICD-10-CM | POA: Diagnosis not present

## 2024-04-30 DIAGNOSIS — L814 Other melanin hyperpigmentation: Secondary | ICD-10-CM | POA: Diagnosis not present

## 2024-06-22 ENCOUNTER — Other Ambulatory Visit: Payer: Self-pay | Admitting: Family Medicine

## 2024-06-22 DIAGNOSIS — Z125 Encounter for screening for malignant neoplasm of prostate: Secondary | ICD-10-CM

## 2024-06-22 DIAGNOSIS — E785 Hyperlipidemia, unspecified: Secondary | ICD-10-CM

## 2024-06-28 ENCOUNTER — Other Ambulatory Visit: Payer: BC Managed Care – PPO

## 2024-06-28 ENCOUNTER — Ambulatory Visit: Payer: Self-pay | Admitting: Family Medicine

## 2024-06-28 DIAGNOSIS — Z125 Encounter for screening for malignant neoplasm of prostate: Secondary | ICD-10-CM

## 2024-06-28 DIAGNOSIS — E785 Hyperlipidemia, unspecified: Secondary | ICD-10-CM

## 2024-06-28 LAB — LIPID PANEL
Cholesterol: 185 mg/dL (ref 28–200)
HDL: 36.2 mg/dL — ABNORMAL LOW
LDL Cholesterol: 125 mg/dL — ABNORMAL HIGH (ref 10–99)
NonHDL: 149.18
Total CHOL/HDL Ratio: 5
Triglycerides: 122 mg/dL (ref 10.0–149.0)
VLDL: 24.4 mg/dL (ref 0.0–40.0)

## 2024-06-28 LAB — COMPREHENSIVE METABOLIC PANEL WITH GFR
ALT: 49 U/L (ref 3–53)
AST: 27 U/L (ref 5–37)
Albumin: 4.7 g/dL (ref 3.5–5.2)
Alkaline Phosphatase: 55 U/L (ref 39–117)
BUN: 14 mg/dL (ref 6–23)
CO2: 30 meq/L (ref 19–32)
Calcium: 9.8 mg/dL (ref 8.4–10.5)
Chloride: 105 meq/L (ref 96–112)
Creatinine, Ser: 0.98 mg/dL (ref 0.40–1.50)
GFR: 87.25 mL/min
Glucose, Bld: 94 mg/dL (ref 70–99)
Potassium: 5 meq/L (ref 3.5–5.1)
Sodium: 142 meq/L (ref 135–145)
Total Bilirubin: 0.7 mg/dL (ref 0.2–1.2)
Total Protein: 7.1 g/dL (ref 6.0–8.3)

## 2024-06-28 LAB — PSA: PSA: 1.72 ng/mL (ref 0.10–4.00)

## 2024-07-05 ENCOUNTER — Encounter: Payer: BC Managed Care – PPO | Admitting: Family Medicine
